# Patient Record
Sex: Female | Born: 1935
Health system: Southern US, Community
[De-identification: ages and names within clinical notes are randomized; demographics above are authoritative.]

## PROBLEM LIST (undated history)

## (undated) DIAGNOSIS — M549 Dorsalgia, unspecified: Secondary | ICD-10-CM

## (undated) DIAGNOSIS — E119 Type 2 diabetes mellitus without complications: Secondary | ICD-10-CM

## (undated) DIAGNOSIS — I10 Essential (primary) hypertension: Secondary | ICD-10-CM

## (undated) HISTORY — DX: Dorsalgia, unspecified: M54.9

## (undated) HISTORY — DX: Type 2 diabetes mellitus without complications: E11.9

## (undated) HISTORY — PX: ABDOMINAL HYSTERECTOMY: SHX81

---

## 1998-03-23 ENCOUNTER — Ambulatory Visit (HOSPITAL_COMMUNITY): Admission: RE | Admit: 1998-03-23 | Discharge: 1998-03-23 | Payer: Self-pay | Admitting: Internal Medicine

## 1999-03-27 ENCOUNTER — Ambulatory Visit (HOSPITAL_COMMUNITY): Admission: RE | Admit: 1999-03-27 | Discharge: 1999-03-27 | Payer: Self-pay | Admitting: Internal Medicine

## 1999-03-27 ENCOUNTER — Encounter: Payer: Self-pay | Admitting: Internal Medicine

## 2000-04-01 ENCOUNTER — Ambulatory Visit (HOSPITAL_COMMUNITY): Admission: RE | Admit: 2000-04-01 | Discharge: 2000-04-01 | Payer: Self-pay | Admitting: Internal Medicine

## 2000-04-01 ENCOUNTER — Encounter: Payer: Self-pay | Admitting: Internal Medicine

## 2001-01-09 ENCOUNTER — Encounter: Payer: Self-pay | Admitting: Internal Medicine

## 2001-01-09 ENCOUNTER — Encounter: Admission: RE | Admit: 2001-01-09 | Discharge: 2001-01-09 | Payer: Self-pay | Admitting: Internal Medicine

## 2001-04-07 ENCOUNTER — Ambulatory Visit (HOSPITAL_COMMUNITY): Admission: RE | Admit: 2001-04-07 | Discharge: 2001-04-07 | Payer: Self-pay | Admitting: Internal Medicine

## 2001-04-07 ENCOUNTER — Encounter: Payer: Self-pay | Admitting: Internal Medicine

## 2001-05-19 ENCOUNTER — Encounter: Admission: RE | Admit: 2001-05-19 | Discharge: 2001-05-19 | Payer: Self-pay | Admitting: Internal Medicine

## 2001-05-19 ENCOUNTER — Encounter: Payer: Self-pay | Admitting: Internal Medicine

## 2002-04-08 ENCOUNTER — Encounter: Payer: Self-pay | Admitting: Internal Medicine

## 2002-04-08 ENCOUNTER — Encounter: Admission: RE | Admit: 2002-04-08 | Discharge: 2002-04-08 | Payer: Self-pay | Admitting: Internal Medicine

## 2002-08-05 ENCOUNTER — Ambulatory Visit (HOSPITAL_COMMUNITY): Admission: RE | Admit: 2002-08-05 | Discharge: 2002-08-05 | Payer: Self-pay | Admitting: *Deleted

## 2002-08-05 ENCOUNTER — Encounter (INDEPENDENT_AMBULATORY_CARE_PROVIDER_SITE_OTHER): Payer: Self-pay | Admitting: Specialist

## 2003-04-21 ENCOUNTER — Encounter: Payer: Self-pay | Admitting: Internal Medicine

## 2003-04-21 ENCOUNTER — Encounter: Admission: RE | Admit: 2003-04-21 | Discharge: 2003-04-21 | Payer: Self-pay | Admitting: Internal Medicine

## 2003-04-28 ENCOUNTER — Emergency Department (HOSPITAL_COMMUNITY): Admission: EM | Admit: 2003-04-28 | Discharge: 2003-04-28 | Payer: Self-pay

## 2003-10-01 HISTORY — PX: COLON SURGERY: SHX602

## 2003-12-10 ENCOUNTER — Emergency Department (HOSPITAL_COMMUNITY): Admission: AD | Admit: 2003-12-10 | Discharge: 2003-12-10 | Payer: Self-pay | Admitting: Internal Medicine

## 2004-02-11 ENCOUNTER — Inpatient Hospital Stay (HOSPITAL_COMMUNITY): Admission: EM | Admit: 2004-02-11 | Discharge: 2004-02-22 | Payer: Self-pay | Admitting: Emergency Medicine

## 2004-02-11 ENCOUNTER — Encounter (INDEPENDENT_AMBULATORY_CARE_PROVIDER_SITE_OTHER): Payer: Self-pay | Admitting: *Deleted

## 2004-05-31 ENCOUNTER — Encounter: Admission: RE | Admit: 2004-05-31 | Discharge: 2004-05-31 | Payer: Self-pay | Admitting: Internal Medicine

## 2004-06-16 ENCOUNTER — Encounter: Admission: RE | Admit: 2004-06-16 | Discharge: 2004-06-16 | Payer: Self-pay | Admitting: Internal Medicine

## 2004-08-28 ENCOUNTER — Ambulatory Visit (HOSPITAL_COMMUNITY): Admission: RE | Admit: 2004-08-28 | Discharge: 2004-08-28 | Payer: Self-pay | Admitting: *Deleted

## 2004-12-08 ENCOUNTER — Emergency Department (HOSPITAL_COMMUNITY): Admission: EM | Admit: 2004-12-08 | Discharge: 2004-12-08 | Payer: Self-pay | Admitting: Family Medicine

## 2005-06-18 ENCOUNTER — Encounter: Admission: RE | Admit: 2005-06-18 | Discharge: 2005-06-18 | Payer: Self-pay | Admitting: Internal Medicine

## 2005-08-28 ENCOUNTER — Ambulatory Visit (HOSPITAL_COMMUNITY): Admission: RE | Admit: 2005-08-28 | Discharge: 2005-08-28 | Payer: Self-pay | Admitting: Internal Medicine

## 2006-01-27 ENCOUNTER — Emergency Department (HOSPITAL_COMMUNITY): Admission: EM | Admit: 2006-01-27 | Discharge: 2006-01-27 | Payer: Self-pay | Admitting: Family Medicine

## 2006-06-23 ENCOUNTER — Encounter: Admission: RE | Admit: 2006-06-23 | Discharge: 2006-06-23 | Payer: Self-pay | Admitting: Internal Medicine

## 2007-06-25 ENCOUNTER — Encounter: Admission: RE | Admit: 2007-06-25 | Discharge: 2007-06-25 | Payer: Self-pay | Admitting: Internal Medicine

## 2007-07-05 ENCOUNTER — Emergency Department (HOSPITAL_COMMUNITY): Admission: EM | Admit: 2007-07-05 | Discharge: 2007-07-05 | Payer: Self-pay | Admitting: Family Medicine

## 2007-09-25 ENCOUNTER — Emergency Department (HOSPITAL_COMMUNITY): Admission: EM | Admit: 2007-09-25 | Discharge: 2007-09-25 | Payer: Self-pay | Admitting: Family Medicine

## 2008-02-27 ENCOUNTER — Emergency Department (HOSPITAL_COMMUNITY): Admission: EM | Admit: 2008-02-27 | Discharge: 2008-02-27 | Payer: Self-pay | Admitting: Emergency Medicine

## 2008-06-07 ENCOUNTER — Emergency Department (HOSPITAL_COMMUNITY): Admission: EM | Admit: 2008-06-07 | Discharge: 2008-06-07 | Payer: Self-pay | Admitting: Emergency Medicine

## 2008-06-27 ENCOUNTER — Encounter: Admission: RE | Admit: 2008-06-27 | Discharge: 2008-06-27 | Payer: Self-pay | Admitting: Internal Medicine

## 2008-06-30 ENCOUNTER — Encounter: Admission: RE | Admit: 2008-06-30 | Discharge: 2008-06-30 | Payer: Self-pay | Admitting: Internal Medicine

## 2008-07-18 ENCOUNTER — Encounter: Admission: RE | Admit: 2008-07-18 | Discharge: 2008-07-18 | Payer: Self-pay | Admitting: Internal Medicine

## 2009-07-04 ENCOUNTER — Encounter: Admission: RE | Admit: 2009-07-04 | Discharge: 2009-07-04 | Payer: Self-pay | Admitting: Internal Medicine

## 2010-04-28 ENCOUNTER — Emergency Department (HOSPITAL_COMMUNITY): Admission: EM | Admit: 2010-04-28 | Discharge: 2010-04-28 | Payer: Self-pay | Admitting: Emergency Medicine

## 2010-07-05 ENCOUNTER — Encounter: Admission: RE | Admit: 2010-07-05 | Discharge: 2010-07-05 | Payer: Self-pay | Admitting: Internal Medicine

## 2011-02-15 NOTE — Op Note (Signed)
   NAMETKAI, LARGE                           ACCOUNT NO.:  [id number]   MEDICAL RECORD NO.:  [id number]                   PATIENT TYPE:  AMB   LOCATION:  ENDO                                 FACILITY:  [facility]   PHYSICIAN:  Georgiana Spinner, M.D.                 DATE OF BIRTH:  Feb 15, 1930   DATE OF PROCEDURE:  DATE OF DISCHARGE:                                 OPERATIVE REPORT   PROCEDURE PERFORMED:  Colonoscopy.   ENDOSCOPIST:  Georgiana Spinner, M.D.   INDICATIONS FOR PROCEDURE:  Colon polyp, rectal bleeding.   ANESTHESIA:  Demerol 40 mg, Versed 3 mg.   DESCRIPTION OF PROCEDURE:  With the patient mildly sedated in the left  lateral decubitus position, the Olympus video colonoscope was inserted in  the rectum and passed under direct vision to the cecum, identified by the  ileocecal valve and appendiceal orifice.  We washed the cecum as best we  could, suctioning debris.  From this point the colonoscope was slowly  withdrawn taking circumferential views of the entire colonic mucosa stopping  in the descending colon where a polyp was seen, photographed and removed  using the hot biopsy forceps technique setting of 20/20 blended current  until we reached the rectum which appeared normal on direct and retroflex  view.  The endoscope was straightened and withdrawn.  The patient's vital  signs and pulse oximeter remained stable.  The patient tolerated the  procedure well without apparent complications.   FINDINGS:  Small polyp of descending colon, otherwise unremarkable exam.   PLAN:  Await biopsy report.  Patient will call me for results and follow up  with me as an outpatient.                                                 Georgiana Spinner, M.D.    GMO/MEDQ  D:  08/05/2002  T:  08/05/2002  Job:  [id number]

## 2011-02-15 NOTE — H&P (Signed)
NAMEABIE, KILLIAN                           ACCOUNT NO.:  [id number]   MEDICAL RECORD NO.:  [id number]                   PATIENT TYPE:  INP   LOCATION:  1825                                 FACILITY:  [facility]   PHYSICIAN:  Abigail Miyamoto, M.D.              DATE OF BIRTH:  1930-03-15   DATE OF ADMISSION:  02/11/2004  DATE OF DISCHARGE:                                HISTORY & PHYSICAL   CHIEF COMPLAINT:  Abdominal pain.   HISTORY:  Karen [name] is a 77 year old female who presents with a 24 hour  history of worsening abdominal pain, nausea and vomiting.  She repots the  pain came on suddenly yesterday and continued to get worse, resulting in  nausea and vomiting.  She reports the pain is crampy in nature and  continuous.  She did have a normal bowel movement this morning, but since  then has passed no flatus and not been able to move her bowels as well.  She  remains nauseated and has thrown up after her CAT scan was performed.  She  denies chest pain, shortness of breath, or dysuria.  She has had no previous  history of abdominal pain.   PAST MEDICAL HISTORY:  Diabetes.   PAST SURGICAL HISTORY:  Hysterectomy.   MEDICATIONS:  Insulin, Glucotrol, Accupril, Lipitor, Actos.   PRIMARY CARE PHYSICIAN:  Veverly Fells. Al[place], M.D.   ALLERGIES:  No known drug allergies.   SOCIAL HISTORY:  She does not smoke and does not drink alcohol.   REVIEW OF SYSTEMS:  Negative from cardiopulmonary standpoint.  It is only  significant for the abdominal pain.   PHYSICAL EXAMINATION:  GENERAL:  Reveals an obese black female who is  uncomfortable.  She is uncomfortable.  VITAL SIGNS:  Temperature is 98.2, respiratory rate 16, heart rate 95, blood  pressure is 159/92.  EYES:  She is anicteric.  Pupils reactive bilaterally.  EARS, NOSE, MOUTH, AND THROAT:  External ears and nose are normal.  Hearing  is normal.  Oropharynx is clear.  NECK:  Supple.  There is no cervical adenopathy.  There is no  thyromegaly.  LUNGS:  Clear to auscultation bilaterally with normal respiratory effort.  CARDIOVASCULAR:  Mildly tachycardic with a regular rate and rhythm.  ABDOMEN:  Distended and obese.  There is tenderness and diffuse guarding.  There is a well healed lower midline incision without evidence of hernia.  There are no bowel sounds.  EXTREMITIES:  Show no edema, clubbing, or cyanosis.  Normal pulses  throughout.  NEUROLOGIC:  She is grossly intact.   LABORATORY DATA:  The patient has a white blood count of 8.8 with a left  shift of 88% neutrophils, hemoglobin is 14.8.  BUN and creatinine are 15 and  0.5, potassium is 5.0, glucose is 168.  Amylase and lipase are normal.  Urinalysis shows 0-2 white blood cells.   ANCILLARY DATA:  The patient  had a CAT scan of the abdomen and pelvis  showing her to have a small-bowel obstruction with possible closed loop  obstruction and positive ascites.   IMPRESSION/PLAN:  This is a patient with a small-bowel obstruction and  findings on CAT scan and physical examination worrisome for ischemic bowel.  At this point, given these findings, the plan is to proceed to the operating  room for exploratory laparotomy and possible bowel resection.  I discussed  this with the patient and her family in detail.  We discussed the risks of  surgery including bleeding, infection, need for bowel resection, ostomy,  etcetera.  At that point, they wish to proceed.  Surgery will thus be  scheduled emergently.                                                Abigail Miyamoto, M.D.    DB/MEDQ  D:  02/11/2004  T:  02/11/2004  Job:  [id number]

## 2011-02-15 NOTE — Discharge Summary (Signed)
[name], [name]                           ACCOUNT NO.:  [id number]   MEDICAL RECORD NO.:  [id number]                   PATIENT TYPE:  INP   LOCATION:  5733                                 FACILITY:  [facility]   PHYSICIAN:  Abigail Miyamoto, M.D.              DATE OF BIRTH:  1930/08/09   DATE OF ADMISSION:  02/11/2004  DATE OF DISCHARGE:  02/22/2004                                 DISCHARGE SUMMARY   DISCHARGE DIAGNOSIS:  Small-bowel obstruction, status post exploratory  laparotomy, lysis of adhesions, removal of bezoar.   OTHER DIAGNOSIS:  Type 2 diabetes.   SUMMARY OF HISTORY:  Ms. Karen [name] is a 77 year old female who presented  to the emergency department with abdominal pain, nausea, and vomiting.  She  was found on examination to have a distended tender abdomen.  A CAT scan of  the abdomen showed the patient to have a possible closed loop obstruction  with ascites.  Given these findings, the decision was made to proceed to the  operating room.  The patient was taken to the operating room where she  underwent exploratory laparotomy and lysis of adhesions, found to have a  bezoar obstructer in her small bowel and this was removed, and she was taken  to a regular surgical floor.  Over the next several days, she had the normal  postoperative care.  She began passing flatus on Feb 14, 2004.  Her diet was  advanced.  She did have drainage from her wound and the wound had to be  opened up and dressing changes were started.  She tolerated a clear liquid  diet and was advanced further.  Her wound, however, continued to have  drainage, so it had to be opened further for the dressing changes.  From the  postoperative standpoint, she was returning to normal bowel function and by  Feb 19, 2004, she was tolerating a regular diet.  Her wound was clean, and  she was tolerating the dressing changes.  By Feb 22, 2004, she was  ambulating, having normal bowel movements.  She was afebrile.  Her  incision  was clean with good granulation tissue.  The decision was made to discharge  the patient home.   Home health is arranged for b.i.d. wet-to-dry dressing changes.   DISCHARGE MEDICATIONS:  1. She will resume her home medications.  2. She will take Vicodin for pain.  3. She will take Tylenol Advil as needed.  Her other medications include:  1. Humalog.  2. Glucotrol.  3. Actos.  4. Lipitor.  5. Aspirin.  6. Accupril.   She will follow up with Dr. Veverly Fells. Altheimer.  She will also follow up  in my office one week post discharge.   ACTIVITY:  She is to do only light activity with no heavy lifting.  Abigail Miyamoto, M.D.    DB/MEDQ  D:  04/03/2004  T:  04/04/2004  Job:  [id number]

## 2011-02-15 NOTE — Op Note (Signed)
   [name], [name]                           ACCOUNT NO.:  [id number]   MEDICAL RECORD NO.:  [id number]                   PATIENT TYPE:  AMB   LOCATION:  ENDO                                 FACILITY:  [facility]   PHYSICIAN:  Georgiana Spinner, M.D.                 DATE OF BIRTH:  [birth year]   DATE OF PROCEDURE:  08/05/2002  DATE OF DISCHARGE:                                 OPERATIVE REPORT   PROCEDURE:  Upper endoscopy.   INDICATIONS:  GERD.   ANESTHESIA:  Demerol 50 mg, Versed 5 mg.   DESCRIPTION OF PROCEDURE:  With the patient mildly sedated in the left  lateral decubitus position, the Olympus videoscopic endoscope was inserted  in the mouth, passed under direct vision through the esophagus, which  appeared normal except for a questionable area of Barrett's which was  photographed and biopsied.  We entered into the stomach.  The fundus, body,  antrum, duodenal bulb, and second portion of the duodenum all appeared  normal.  From this point the endoscope was slowly withdrawn, taking  circumferential views of the remaining gastric and esophageal mucosa.  The  patient's vital signs and pulse oximetry remained stable.  The patient  tolerated the procedure well without apparent complications.   FINDINGS:  Questionable Barrett's esophagus, biopsied.   PLAN:  Await biopsy report.  The patient will call me for results and follow  up with me as an outpatient.  Proceed to colonoscopy as planned.                                               Georgiana Spinner, M.D.    GMO/MEDQ  D:  08/05/2002  T:  08/05/2002  Job:  [id number]

## 2011-02-15 NOTE — Op Note (Signed)
NAMEHAGAN, MALTZ                           ACCOUNT NO.:  [id number]   MEDICAL RECORD NO.:  [id number]                   PATIENT TYPE:  INP   LOCATION:  5733                                 FACILITY:  [facility]   PHYSICIAN:  Abigail Miyamoto, M.D.              DATE OF BIRTH:  07-Nov-1929   DATE OF PROCEDURE:  02/11/2004  DATE OF DISCHARGE:                                 OPERATIVE REPORT   PREOPERATIVE DIAGNOSIS:  Small bowel obstruction.   POSTOPERATIVE DIAGNOSIS:  Small bowel obstruction.   PROCEDURE:  1. Exploratory laparotomy.  2. Lysis of adhesions.  3. Excision of small bowel bezoar.   SURGEON:  Abigail Miyamoto, M.D.   ASSISTANT:  Lebron Conners, M.D.   ANESTHESIA:  General endotracheal.   ESTIMATED BLOOD LOSS:  Minimal.   INDICATIONS:  Karen [name] is a 77 year old female who presents with a small  bowel obstruction.  CAT scan shows what appears to be closed loop  obstruction with possible inspissated stool in the small bowel.  Given her  tenderness on physical examination, decision was made to proceed to the  operating room for exploration.   FINDINGS:  The patient was noted to have multiple extensive adhesions.  She  had a very large bezoar in the small intestine causing the obstruction.  This was removed through a single enterotomy.   DESCRIPTION OF PROCEDURE:  The patient was brought to the operating room and  identified as Karen [name].  She was placed supine on the operating table  and general anesthesia was induced.  Her abdomen was then prepped and draped  in the usual sterile fashion.  Using a #10 blade a midline incision was made  and incision was carried down through the fascia with the electrocautery.  The peritoneum was opened the entire length of the incision.  Upon entering  the abdomen, the patient was found to have multiple adhesions consisting of  omentum to the abdominal wall.  This was taken down with the electrocautery.  The patient was found to  have multiple loops of small bowel stuck to the  pelvis which were elevated after taking down with the Metzenbaum scissors.  After extensive lysis of adhesions, the area of obstruction could be  identified and was found to be a foreign body in the small intestines.  After the small intestines were finally freed up, enterotomy was created and  a large bezoar was removed from the small intestines.  This was sent to  pathology for identification.  The enterotomy was then closed in a  transverse fashion with interrupted 3-0 silk sutures.  The repair was  tested, and no leak was identified.  The rest of the small bowel was  examined and no further obstruction was identified.  At this point, the  abdomen was irrigated with normal saline.  The fascia was then closed with  running #1 PDS suture.  The skin was irrigated  and closed with skin  staples.  Gauze and tape were then applied.  The patient tolerated the  procedure well.  All sponge, needle and instrument counts were correct at  the end of the procedure.  The patient was then extubated in the operating  room and taken to recovery room.                                               Abigail Miyamoto, M.D.    DB/MEDQ  D:  02/11/2004  T:  02/12/2004  Job:  [id number]

## 2011-02-15 NOTE — Op Note (Signed)
Karen [name], Karen [name]                 ACCOUNT NO.:  [id number]   MEDICAL RECORD NO.:  [id number]          PATIENT TYPE:  AMB   LOCATION:  ENDO                         FACILITY:  [facility]   PHYSICIAN:  Georgiana Spinner, M.D.    DATE OF BIRTH:  [birth year]   DATE OF PROCEDURE:  08/28/2004  DATE OF DISCHARGE:                                 OPERATIVE REPORT   PROCEDURE PERFORMED:  Colonoscopy.   ENDOSCOPIST:  Georgiana Spinner, M.D.   INDICATIONS FOR PROCEDURE:  Colon polyps.   ANESTHESIA:  Demerol 75 mg, Versed 7.5 mg   DESCRIPTION OF PROCEDURE:  With the patient mildly sedated in the left  lateral decubitus position, the Olympus videoscopic colonoscope was inserted  in the rectum and passed under direct vision to the cecum, identified by the  ileocecal valve and base of cecum, both of which were photographed.  From  this point the colonoscope was slowly withdrawn taking circumferential views  of the colonic mucosa stopping only in the rectum which appeared normal on  direct and showed hemorrhoids on retroflex view.  The endoscope was  straightened and withdrawn.  The patient's vital signs and pulse oximeter  remained stable.  The patient tolerated the procedure well without apparent  complications.   FINDINGS:  Internal hemorrhoids.  Occasional diverticula in the sigmoid  colon, otherwise unremarkable examination.   PLAN:  Consider repeat examination in five years.       GMO/MEDQ  D:  08/28/2004  T:  08/28/2004  Job:  [id number]

## 2011-05-28 ENCOUNTER — Other Ambulatory Visit: Payer: Self-pay | Admitting: Internal Medicine

## 2011-05-28 DIAGNOSIS — Z1231 Encounter for screening mammogram for malignant neoplasm of breast: Secondary | ICD-10-CM

## 2011-06-26 LAB — DIFFERENTIAL
Basophils Absolute: 0
Basophils Relative: 0
Eosinophils Absolute: 0
Eosinophils Relative: 0
Monocytes Absolute: 0.2

## 2011-06-26 LAB — POCT I-STAT, CHEM 8
Chloride: 107
HCT: 43
Potassium: 4.2
Sodium: 143

## 2011-06-26 LAB — CBC
HCT: 39.9
MCV: 85.7

## 2011-07-03 LAB — CBC
HCT: 38.7
MCHC: 32.4
MCV: 86.8
WBC: 5.4

## 2011-07-03 LAB — POCT I-STAT, CHEM 8
BUN: 19
Calcium, Ion: 1.26
Glucose, Bld: 57 — ABNORMAL LOW
HCT: 40
Hemoglobin: 13.6

## 2011-07-03 LAB — DIFFERENTIAL
Basophils Absolute: 0
Eosinophils Relative: 1
Monocytes Absolute: 0.6
Monocytes Relative: 12
Neutro Abs: 3.2

## 2011-07-03 LAB — D-DIMER, QUANTITATIVE: D-Dimer, Quant: 0.26

## 2011-07-08 ENCOUNTER — Ambulatory Visit
Admission: RE | Admit: 2011-07-08 | Discharge: 2011-07-08 | Disposition: A | Discharge status: Home / Self Care | Payer: Medicare Other | Source: Ambulatory Visit | Attending: Internal Medicine | Admitting: Internal Medicine

## 2011-07-08 DIAGNOSIS — Z1231 Encounter for screening mammogram for malignant neoplasm of breast: Secondary | ICD-10-CM

## 2012-06-03 ENCOUNTER — Other Ambulatory Visit: Payer: Self-pay | Admitting: Internal Medicine

## 2012-06-03 DIAGNOSIS — Z1231 Encounter for screening mammogram for malignant neoplasm of breast: Secondary | ICD-10-CM

## 2012-07-08 ENCOUNTER — Ambulatory Visit
Admission: RE | Admit: 2012-07-08 | Discharge: 2012-07-08 | Disposition: A | Discharge status: Home / Self Care | Payer: Medicare Other | Source: Ambulatory Visit | Attending: Internal Medicine | Admitting: Internal Medicine

## 2012-07-08 DIAGNOSIS — Z1231 Encounter for screening mammogram for malignant neoplasm of breast: Secondary | ICD-10-CM

## 2013-06-08 ENCOUNTER — Other Ambulatory Visit: Payer: Self-pay

## 2013-06-08 DIAGNOSIS — Z1231 Encounter for screening mammogram for malignant neoplasm of breast: Secondary | ICD-10-CM

## 2013-07-09 ENCOUNTER — Ambulatory Visit
Admission: RE | Admit: 2013-07-09 | Discharge: 2013-07-09 | Disposition: A | Discharge status: Home / Self Care | Payer: Medicare Other | Source: Ambulatory Visit

## 2013-07-09 DIAGNOSIS — Z1231 Encounter for screening mammogram for malignant neoplasm of breast: Secondary | ICD-10-CM

## 2014-01-17 ENCOUNTER — Encounter: Payer: Self-pay | Admitting: Podiatry

## 2014-01-17 ENCOUNTER — Ambulatory Visit (INDEPENDENT_AMBULATORY_CARE_PROVIDER_SITE_OTHER): Payer: Medicare Other | Admitting: Podiatry

## 2014-01-17 VITALS — BP 124/66 | HR 78 | Resp 15 | Ht 61.0 in | Wt 160.0 lb

## 2014-01-17 DIAGNOSIS — M2040 Other hammer toe(s) (acquired), unspecified foot: Secondary | ICD-10-CM

## 2014-01-17 NOTE — Progress Notes (Signed)
   Subjective:    Patient ID: Karen [name], female    DOB: 1930-06-09, 80 y.o.   MRN: [id number]  HPI Comments: N toe pain L B/L 1 - 5 toe tips D 3 month O on and off C stinging A enclosed shoes T home pedicure, non treatment for the toe tenderness except Diabetic lotion  Pt request diabetic shoes.  Toe Pain       Review of Systems  All other systems reviewed and are negative.      Objective:   Physical Exam  Orientated x3 space female  Vascular: DP and PT pulses 1/4 bilaterally  Neurological: sensation to 10 g monofilament wire intact 5/5 bilaterally. Vibratory sensation intact bilaterally. Ankle reflexes reactive bilaterally.  Dermatological: Discolored, incurvated toenails x10  Musculoskeletal: Hammertoes 2, bilaterally. Taylor's bunions bilaterally.       Assessment & Plan:   Assessment: Satisfactory neurovascular status Hammertoe deformities second bilaterally Taylor's bunion deformity bilaterally Symptoms of stinging may suggest beginning diabetic neuropathy Asymptomatic onychomycoses  Plan: Patient is given General Information about diabetic footcare. Today I just gently burred down the toenails without a bleeding Advised patient that her symptoms of stinging may beginning of diabetic neuropathy Symptoms may exacerbated by hammertoes  Reappoint at patient's request or anteriorly intervals.

## 2014-01-17 NOTE — Patient Instructions (Signed)
Diabetes and Foot Care Diabetes may cause you to have problems because of poor blood supply (circulation) to your feet and legs. This may cause the skin on your feet to become thinner, break easier, and heal more slowly. Your skin may become dry, and the skin may peel and crack. You may also have nerve damage in your legs and feet causing decreased feeling in them. You may not notice minor injuries to your feet that could lead to infections or more serious problems. Taking care of your feet is one of the most important things you can do for yourself.  HOME CARE INSTRUCTIONS  Wear shoes at all times, even in the house. Do not go barefoot. Bare feet are easily injured.  Check your feet daily for blisters, cuts, and redness. If you cannot see the bottom of your feet, use a mirror or ask someone for help.  Wash your feet with warm water (do not use hot water) and mild soap. Then pat your feet and the areas between your toes until they are completely dry. Do not soak your feet as this can dry your skin.  Apply a moisturizing lotion or petroleum jelly (that does not contain alcohol and is unscented) to the skin on your feet and to dry, brittle toenails. Do not apply lotion between your toes.  Trim your toenails straight across. Do not dig under them or around the cuticle. File the edges of your nails with an emery board or nail file.  Do not cut corns or calluses or try to remove them with medicine.  Wear clean socks or stockings every day. Make sure they are not too tight. Do not wear knee-high stockings since they may decrease blood flow to your legs.  Wear shoes that fit properly and have enough cushioning. To break in new shoes, wear them for just a few hours a day. This prevents you from injuring your feet. Always look in your shoes before you put them on to be sure there are no objects inside.  Do not cross your legs. This may decrease the blood flow to your feet.  If you find a minor scrape,  cut, or break in the skin on your feet, keep it and the skin around it clean and dry. These areas may be cleansed with mild soap and water. Do not cleanse the area with peroxide, alcohol, or iodine.  When you remove an adhesive bandage, be sure not to damage the skin around it.  If you have a wound, look at it several times a day to make sure it is healing.  Do not use heating pads or hot water bottles. They may burn your skin. If you have lost feeling in your feet or legs, you may not know it is happening until it is too late.  Make sure your health care provider performs a complete foot exam at least annually or more often if you have foot problems. Report any cuts, sores, or bruises to your health care provider immediately. SEEK MEDICAL CARE IF:   You have an injury that is not healing.  You have cuts or breaks in the skin.  You have an ingrown nail.  You notice redness on your legs or feet.  You feel burning or tingling in your legs or feet.  You have pain or cramps in your legs and feet.  Your legs or feet are numb.  Your feet always feel cold. SEEK IMMEDIATE MEDICAL CARE IF:   There is increasing redness,   swelling, or pain in or around a wound.  There is a red line that goes up your leg.  Pus is coming from a wound.  You develop a fever or as directed by your health care provider.  You notice a bad smell coming from an ulcer or wound. Document Released: 09/13/2000 Document Revised: 05/19/2013 Document Reviewed: 02/23/2013 [employer] Patient Information 2014 [employer].  

## 2014-01-18 ENCOUNTER — Encounter: Payer: Self-pay | Admitting: Podiatry

## 2014-06-07 ENCOUNTER — Other Ambulatory Visit: Payer: Self-pay

## 2014-06-07 DIAGNOSIS — Z1231 Encounter for screening mammogram for malignant neoplasm of breast: Secondary | ICD-10-CM

## 2014-07-11 ENCOUNTER — Ambulatory Visit
Admission: RE | Admit: 2014-07-11 | Discharge: 2014-07-11 | Disposition: A | Discharge status: Home / Self Care | Payer: Medicare Other | Source: Ambulatory Visit

## 2014-07-11 DIAGNOSIS — Z1231 Encounter for screening mammogram for malignant neoplasm of breast: Secondary | ICD-10-CM

## 2014-10-10 ENCOUNTER — Emergency Department (HOSPITAL_COMMUNITY): Admission: EM | Admit: 2014-10-10 | Discharge: 2014-10-10 | Payer: Medicare Other | Source: Home / Self Care

## 2015-03-10 ENCOUNTER — Encounter (HOSPITAL_COMMUNITY): Payer: Self-pay | Admitting: *Deleted

## 2015-03-10 ENCOUNTER — Emergency Department (HOSPITAL_COMMUNITY)
Admission: EM | Admit: 2015-03-10 | Discharge: 2015-03-10 | Disposition: A | Discharge status: Home / Self Care | Payer: Medicare Other | Source: Home / Self Care | Attending: Family Medicine | Admitting: Family Medicine

## 2015-03-10 DIAGNOSIS — M5442 Lumbago with sciatica, left side: Secondary | ICD-10-CM | POA: Diagnosis not present

## 2015-03-10 MED ORDER — TRAMADOL HCL 50 MG PO TABS: 50.0000 mg | ORAL_TABLET | Freq: Four times a day (QID) | ORAL | Status: DC | PRN

## 2015-03-10 NOTE — ED Notes (Signed)
Pt is here with complaints of back and hip muscle pain. Pt states it "cramps up" when she coughs.

## 2015-03-10 NOTE — Discharge Instructions (Signed)
Thank you for coming in today. °Come back or go to the emergency room if you notice new weakness new numbness problems walking or bowel or bladder problems. ° ° °Back Exercises °Back exercises help treat and prevent back injuries. The goal is to increase your strength in your belly (abdominal) and back muscles. These exercises can also help with flexibility. Start these exercises when told by your doctor. °HOME CARE °Back exercises include: °Pelvic Tilt. °· Lie on your back with your knees bent. Tilt your pelvis until the lower part of your back is against the floor. Hold this position 5 to 10 sec. Repeat this exercise 5 to 10 times. °Knee to Chest. °· Pull 1 knee up against your chest and hold for 20 to 30 seconds. Repeat this with the other knee. This may be done with the other leg straight or bent, whichever feels better. Then, pull both knees up against your chest. °Sit-Ups or Curl-Ups. °· Bend your knees 90 degrees. Start with tilting your pelvis, and do a partial, slow sit-up. Only lift your upper half 30 to 45 degrees off the floor. Take at least 2 to 3 seonds for each sit-up. Do not do sit-ups with your knees out straight. If partial sit-ups are difficult, simply do the above but with only tightening your belly (abdominal) muscles and holding it as told. °Hip-Lift. °· Lie on your back with your knees flexed 90 degrees. Push down with your feet and shoulders as you raise your hips 2 inches off the floor. Hold for 10 seconds, repeat 5 to 10 times. °Back Arches. °· Lie on your stomach. Prop yourself up on bent elbows. Slowly press on your hands, causing an arch in your low back. Repeat 3 to 5 times. °Shoulder-Lifts. °· Lie face down with arms beside your body. Keep hips and belly pressed to floor as you slowly lift your head and shoulders off the floor. °Do not overdo your exercises. Be careful in the beginning. Exercises may cause you some mild back discomfort. If the pain lasts for more than 15 minutes, stop  the exercises until you see your doctor. Improvement with exercise for back problems is slow.  °Document Released: 10/19/2010 Document Revised: 12/09/2011 Document Reviewed: 07/18/2011 °[employer]® Patient Information ©2015 [employer]. This information is not intended to replace advice given to you by your health care provider. Make sure you discuss any questions you have with your health care provider. ° °

## 2015-03-10 NOTE — ED Provider Notes (Signed)
Karen [name] is a 81 y.o. female who presents to Urgent Care today for back pain. Patient notes new onset of acute low back pain starting a week ago. The pain is felt in the left buttocks and radiates to the left knee. She denies any new weakness or numbness bowel bladder dysfunction or difficulty walking. She's tried Tylenol and leftover tramadol which helped a lot. She ran out of tramadol. Wife. She denies any injury. No fevers or chills vomiting or diarrhea.   Past Medical History  Diagnosis Date  . Diabetes mellitus without complication   . Back pain    Past Surgical History  Procedure Laterality Date  . Abdominal hysterectomy    . Colon surgery  2005    blockage   History  Substance Use Topics  . Smoking status: Never Smoker   . Smokeless tobacco: Not on file  . Alcohol Use: No   ROS as above Medications: No current facility-administered medications for this encounter.   Current Outpatient Prescriptions  Medication Sig Dispense Refill  . Acetaminophen (TYLENOL 8 HOUR PO) Take by mouth as needed.    . Ascorbic Acid (VITAMIN C) 100 MG tablet Take 100 mg by mouth daily.    Marland Kitchen aspirin 81 MG tablet Take 81 mg by mouth daily.    . Calcium Carbonate-Vitamin D (CALCIUM + D PO) Take by mouth.    Marland Kitchen glipiZIDE (GLUCOTROL XL) 5 MG 24 hr tablet Take 5 mg by mouth daily with breakfast.    . HYDROcodone-acetaminophen (NORCO/VICODIN) 5-325 MG per tablet Take 1 tablet by mouth every 6 (six) hours as needed for moderate pain.    Marland Kitchen insulin lispro protamine-lispro (HUMALOG 75/25 MIX) (75-25) 100 UNIT/ML SUSP injection Inject into the skin 2 (two) times daily with a meal.    . liver oil-zinc oxide (DESITIN) 40 % ointment Apply 1 application topically as needed for irritation.    . Multiple Vitamins-Minerals (MULTI COMPLETE PO) Take by mouth.    . Omega-3 Fatty Acids (FISH OIL) 1000 MG CAPS Take by mouth.    . quinapril (ACCUPRIL) 5 MG tablet Take 5 mg by mouth at bedtime.    . traMADol (ULTRAM)  50 MG tablet Take 1 tablet (50 mg total) by mouth every 6 (six) hours as needed. 20 tablet 0   No Known Allergies   Exam:  BP 166/83 mmHg  Pulse 80  Temp(Src) 97.2 F (36.2 C) (Oral)  SpO2 95% Gen: Well NAD HEENT: EOMI,  MMM Lungs: Normal work of breathing. CTABL Heart: RRR no MRG Abd: NABS, Soft. Nondistended, Nontender Exts: Brisk capillary refill, warm and well perfused.  Back: Nontender to spinal midline. Tender palpation left lumbar paraspinal and SI joint.  Negative straight leg raise test and slump test bilaterally. Positive left-sided Pearlean Brownie test negative right. Antalgic gait. Patient uses a cane to ambulate. Lower extreme strength is intact. She can climb onto and off exam table and stand up easily. Reflexes are diminished but equal bilateral knees and ankles. Sensation is intact throughout  No results found for this or any previous visit (from the past 24 hour(s)). No results found.  Assessment and Plan: 81 y.o. female with lumbago with possible sciatica. Treat with tramadol and Tylenol. Avoid prednisone and NSAIDs if able due to diabetes and elevated blood pressure. Follow-up with PCP.  Discussed warning signs or symptoms. Please see discharge instructions. Patient expresses understanding.     Rodolph Bong, MD 03/10/15 1902

## 2015-06-06 ENCOUNTER — Other Ambulatory Visit: Payer: Self-pay

## 2015-06-06 DIAGNOSIS — Z1231 Encounter for screening mammogram for malignant neoplasm of breast: Secondary | ICD-10-CM

## 2015-07-14 ENCOUNTER — Ambulatory Visit
Admission: RE | Admit: 2015-07-14 | Discharge: 2015-07-14 | Disposition: A | Discharge status: Home / Self Care | Payer: Medicare Other | Source: Ambulatory Visit

## 2015-07-14 ENCOUNTER — Ambulatory Visit: Payer: Medicare Other

## 2015-07-14 DIAGNOSIS — Z1231 Encounter for screening mammogram for malignant neoplasm of breast: Secondary | ICD-10-CM

## 2015-12-27 ENCOUNTER — Encounter: Payer: Self-pay | Admitting: Podiatry

## 2015-12-27 ENCOUNTER — Ambulatory Visit (INDEPENDENT_AMBULATORY_CARE_PROVIDER_SITE_OTHER): Payer: Medicare Other | Admitting: Podiatry

## 2015-12-27 VITALS — BP 144/68 | HR 82 | Resp 12

## 2015-12-27 DIAGNOSIS — M2041 Other hammer toe(s) (acquired), right foot: Secondary | ICD-10-CM | POA: Diagnosis not present

## 2015-12-27 DIAGNOSIS — L84 Corns and callosities: Secondary | ICD-10-CM | POA: Diagnosis not present

## 2015-12-27 DIAGNOSIS — E119 Type 2 diabetes mellitus without complications: Secondary | ICD-10-CM

## 2015-12-27 NOTE — Progress Notes (Signed)
   Subjective:    Patient ID: Karen ParsleyElnora P Dovidio, female    DOB: [birth year], 82 y.o.   MRN: [id number]  HPI    This patient presents today complaining of a one-month history of pain in a skin lesion on the medial distal second right toe which is uncomfortable walking wearing shoes. Patient has applied Vaseline and a Band-Aid to the area which does reduce the symptoms which are somewhat intermittent. Patient carries her shoes with a wider toe box density reduce some of the discomfort in the second right toe. She denies any infection or professional care  Patient is a diabetic denies any history of ulceration, amputation or claudication  Review of Systems  Skin: Positive for color change.       Objective:   Physical Exam  Orientated 3  Vascular: DP and PT pulses 1/4 bilaterally Capillary reflex immediate bilaterally  Neurological: Sensation to 10 g monofilament wire intact 5/5 right 4/5 left Vibratory sensation reactive bilaterally Ankle reflex equal and reactive bilaterally  Dermatological: No open skin lesions bilaterally Minimal keratoses distal medial second right toe without any localized inflammation  Musculoskeletal: Hammertoe second bilaterally Medial transverse drifting of lateral digits 2-4 bilaterally Bunionette's bilaterally      Assessment & Plan:   Assessment: Type II diabetic without foot complications Hammertoe second right with keratoses  Plan: Today I reviewed the results of examination with patient today. I recommended she wears a shoe with a wide toe box. The keratoses was debrided. Patient advised to apply Vaseline and a Band-Aid to this area on a when necessary basis. Also discussed silicone toe separator or silicone sleeve over second right toe  Return as needed or yearly

## 2015-12-27 NOTE — Patient Instructions (Signed)
Today year diabetic foot exam revealed adequate circulation and feeling in your feet There is a corn on the inside of the second right toe resulting from a hammertoe and friction/rubbing of the second toe against the right big toe As needed apply Vaseline and a small Band-Aid daily to protect the area Return as needed or yearly  Hammer Toes Hammer toes is a condition in which one or more of your toes is permanently flexed. CAUSES  This happens when a muscle imbalance or abnormal bone length makes your small toes buckle. This causes the toe joint to contract and the strong cord-like bands that attach muscles to the bones (tendons) in your toes to shorten.  SIGNS AND SYMPTOMS  Common symptoms of flexible hammer toes include:   A buildup of skin cells (corns). Corns occur where boney bumps come in frequent contact with hard surfaces. For example, where your shoes press and rub.  Irritation.  Inflammation.  Pain.  Limited motion in your toes. DIAGNOSIS  Hammer toes are diagnosed through a physical exam of your toes. During the exam, your health care provider may try to reproduce your symptoms by manipulating your foot. Often, X-ray exams are done to determine the degree of deformity and to make sure that the cause is not a fracture.  TREATMENT  Hammer toes can be treated with corrective surgery. There are several types of surgical procedures that can treat hammer toes. The most common procedures include:  Arthroplasty--A portion of the joint is surgically removed and your toe is straightened. The gap fills in with fibrous tissue. This procedure helps treat pain and deformity and helps restore function.  Fusion--Cartilage between the two bones of the affected joint is taken out and the bones fuse together into one longer bone. This helps keep your toe stable and reduces pain but leaves your toe stiff, yet straight.  Implantation--A portion of your bone is removed and replaced with an  implant to restore motion.  Flexor tendon transfers--This procedure repositions the tendons that curl the toes down (flexor tendons). This may be done to release the deforming force that causes your toe to buckle. Several of these procedures require fixing your toe with a pin that is visible at the tip of your toe. The pin keeps the toe straight during healing. Your health care provider will remove the pin usually within 4-8 weeks after the procedure.    This information is not intended to replace advice given to you by your health care provider. Make sure you discuss any questions you have with your health care provider.   Document Released: 09/13/2000 Document Revised: 09/21/2013 Document Reviewed: 05/24/2013 Elsevier Interactive Patient Education 2016 [employer] Diabetes and Foot Care Diabetes may cause you to have problems because of poor blood supply (circulation) to your feet and legs. This may cause the skin on your feet to become thinner, break easier, and heal more slowly. Your skin may become dry, and the skin may peel and crack. You may also have nerve damage in your legs and feet causing decreased feeling in them. You may not notice minor injuries to your feet that could lead to infections or more serious problems. Taking care of your feet is one of the most important things you can do for yourself.  HOME CARE INSTRUCTIONS  Wear shoes at all times, even in the house. Do not go barefoot. Bare feet are easily injured.  Check your feet daily for blisters, cuts, and redness. If you cannot see the  bottom of your feet, use a mirror or ask someone for help.  Wash your feet with warm water (do not use hot water) and mild soap. Then pat your feet and the areas between your toes until they are completely dry. Do not soak your feet as this can dry your skin.  Apply a moisturizing lotion or petroleum jelly (that does not contain alcohol and is unscented) to the skin on your feet and to dry,  brittle toenails. Do not apply lotion between your toes.  Trim your toenails straight across. Do not dig under them or around the cuticle. File the edges of your nails with an emery board or nail file.  Do not cut corns or calluses or try to remove them with medicine.  Wear clean socks or stockings every day. Make sure they are not too tight. Do not wear knee-high stockings since they may decrease blood flow to your legs.  Wear shoes that fit properly and have enough cushioning. To break in new shoes, wear them for just a few hours a day. This prevents you from injuring your feet. Always look in your shoes before you put them on to be sure there are no objects inside.  Do not cross your legs. This may decrease the blood flow to your feet.  If you find a minor scrape, cut, or break in the skin on your feet, keep it and the skin around it clean and dry. These areas may be cleansed with mild soap and water. Do not cleanse the area with peroxide, alcohol, or iodine.  When you remove an adhesive bandage, be sure not to damage the skin around it.  If you have a wound, look at it several times a day to make sure it is healing.  Do not use heating pads or hot water bottles. They may burn your skin. If you have lost feeling in your feet or legs, you may not know it is happening until it is too late.  Make sure your health care provider performs a complete foot exam at least annually or more often if you have foot problems. Report any cuts, sores, or bruises to your health care provider immediately. SEEK MEDICAL CARE IF:   You have an injury that is not healing.  You have cuts or breaks in the skin.  You have an ingrown nail.  You notice redness on your legs or feet.  You feel burning or tingling in your legs or feet.  You have pain or cramps in your legs and feet.  Your legs or feet are numb.  Your feet always feel cold. SEEK IMMEDIATE MEDICAL CARE IF:   There is increasing redness,  swelling, or pain in or around a wound.  There is a red line that goes up your leg.  Pus is coming from a wound.  You develop a fever or as directed by your health care provider.  You notice a bad smell coming from an ulcer or wound.   This information is not intended to replace advice given to you by your health care provider. Make sure you discuss any questions you have with your health care provider.   Document Released: 09/13/2000 Document Revised: 05/19/2013 Document Reviewed: 02/23/2013 Elsevier Interactive Patient Education [employer] Else[employer] Inc[employer]

## 2016-06-25 ENCOUNTER — Other Ambulatory Visit: Payer: Self-pay | Admitting: Internal Medicine

## 2016-06-25 DIAGNOSIS — Z1231 Encounter for screening mammogram for malignant neoplasm of breast: Secondary | ICD-10-CM

## 2016-06-29 ENCOUNTER — Encounter (HOSPITAL_COMMUNITY): Payer: Self-pay

## 2016-06-29 ENCOUNTER — Observation Stay (HOSPITAL_COMMUNITY)
Admission: EM | Admit: 2016-06-29 | Discharge: 2016-06-30 | Disposition: A | Discharge status: Home / Self Care | Payer: Medicare Other | Attending: Internal Medicine | Admitting: Internal Medicine

## 2016-06-29 ENCOUNTER — Emergency Department (HOSPITAL_COMMUNITY): Payer: Medicare Other

## 2016-06-29 DIAGNOSIS — S0081XA Abrasion of other part of head, initial encounter: Secondary | ICD-10-CM | POA: Diagnosis not present

## 2016-06-29 DIAGNOSIS — Z833 Family history of diabetes mellitus: Secondary | ICD-10-CM | POA: Diagnosis not present

## 2016-06-29 DIAGNOSIS — R55 Syncope and collapse: Secondary | ICD-10-CM | POA: Insufficient documentation

## 2016-06-29 DIAGNOSIS — W19XXXA Unspecified fall, initial encounter: Secondary | ICD-10-CM | POA: Diagnosis not present

## 2016-06-29 DIAGNOSIS — Z794 Long term (current) use of insulin: Secondary | ICD-10-CM | POA: Insufficient documentation

## 2016-06-29 DIAGNOSIS — Z9889 Other specified postprocedural states: Secondary | ICD-10-CM | POA: Insufficient documentation

## 2016-06-29 DIAGNOSIS — S06300A Unspecified focal traumatic brain injury without loss of consciousness, initial encounter: Principal | ICD-10-CM | POA: Insufficient documentation

## 2016-06-29 DIAGNOSIS — Z7982 Long term (current) use of aspirin: Secondary | ICD-10-CM | POA: Insufficient documentation

## 2016-06-29 DIAGNOSIS — E119 Type 2 diabetes mellitus without complications: Secondary | ICD-10-CM | POA: Diagnosis not present

## 2016-06-29 DIAGNOSIS — I629 Nontraumatic intracranial hemorrhage, unspecified: Secondary | ICD-10-CM | POA: Diagnosis present

## 2016-06-29 DIAGNOSIS — Z791 Long term (current) use of non-steroidal anti-inflammatories (NSAID): Secondary | ICD-10-CM | POA: Diagnosis not present

## 2016-06-29 DIAGNOSIS — M1990 Unspecified osteoarthritis, unspecified site: Secondary | ICD-10-CM | POA: Diagnosis not present

## 2016-06-29 DIAGNOSIS — Z90710 Acquired absence of both cervix and uterus: Secondary | ICD-10-CM | POA: Insufficient documentation

## 2016-06-29 DIAGNOSIS — E861 Hypovolemia: Secondary | ICD-10-CM | POA: Insufficient documentation

## 2016-06-29 DIAGNOSIS — I10 Essential (primary) hypertension: Secondary | ICD-10-CM | POA: Diagnosis not present

## 2016-06-29 DIAGNOSIS — Z79899 Other long term (current) drug therapy: Secondary | ICD-10-CM | POA: Insufficient documentation

## 2016-06-29 LAB — CBC
HEMATOCRIT: 39.7 % (ref 36.0–46.0)
Hemoglobin: 13.2 g/dL (ref 12.0–15.0)
MCH: 28.4 pg (ref 26.0–34.0)
MCHC: 33.2 g/dL (ref 30.0–36.0)
MCV: 85.4 fL (ref 78.0–100.0)
PLATELETS: 274 10*3/uL (ref 150–400)
RBC: 4.65 MIL/uL (ref 3.87–5.11)
RDW: 13.7 % (ref 11.5–15.5)
WBC: 5.7 10*3/uL (ref 4.0–10.5)

## 2016-06-29 LAB — I-STAT CHEM 8, ED
BUN: 12 mg/dL (ref 6–20)
CALCIUM ION: 1.19 mmol/L (ref 1.15–1.40)
CHLORIDE: 104 mmol/L (ref 101–111)
CREATININE: 0.8 mg/dL (ref 0.44–1.00)
GLUCOSE: 127 mg/dL — AB (ref 65–99)
HCT: 41 % (ref 36.0–46.0)
Hemoglobin: 13.9 g/dL (ref 12.0–15.0)
POTASSIUM: 4.1 mmol/L (ref 3.5–5.1)
Sodium: 142 mmol/L (ref 135–145)
TCO2: 28 mmol/L (ref 0–100)

## 2016-06-29 LAB — GLUCOSE, CAPILLARY: Glucose-Capillary: 117 mg/dL — ABNORMAL HIGH (ref 65–99)

## 2016-06-29 LAB — URINALYSIS, ROUTINE W REFLEX MICROSCOPIC
BILIRUBIN URINE: NEGATIVE
GLUCOSE, UA: NEGATIVE mg/dL
HGB URINE DIPSTICK: NEGATIVE
KETONES UR: NEGATIVE mg/dL
LEUKOCYTES UA: NEGATIVE
Nitrite: NEGATIVE
PH: 8 (ref 5.0–8.0)
PROTEIN: NEGATIVE mg/dL
Specific Gravity, Urine: 1.012 (ref 1.005–1.030)

## 2016-06-29 MED ORDER — LISINOPRIL 20 MG PO TABS
40.0000 mg | ORAL_TABLET | Freq: Every day | ORAL | Status: DC
  Administered 2016-06-30: 40 mg via ORAL
  Filled 2016-06-29: qty 2

## 2016-06-29 MED ORDER — ONDANSETRON HCL 4 MG/2ML IJ SOLN: 4.0000 mg | Freq: Four times a day (QID) | INTRAMUSCULAR | Status: DC | PRN

## 2016-06-29 MED ORDER — ACETAMINOPHEN 325 MG PO TABS
650.0000 mg | ORAL_TABLET | Freq: Four times a day (QID) | ORAL | Status: DC | PRN
  Administered 2016-06-30: 650 mg via ORAL
  Filled 2016-06-29: qty 2

## 2016-06-29 MED ORDER — ONDANSETRON HCL 4 MG PO TABS: 4.0000 mg | ORAL_TABLET | Freq: Four times a day (QID) | ORAL | Status: DC | PRN

## 2016-06-29 MED ORDER — INSULIN ASPART 100 UNIT/ML ~~LOC~~ SOLN
0.0000 [IU] | Freq: Three times a day (TID) | SUBCUTANEOUS | Status: DC
  Administered 2016-06-30: 2 [IU] via SUBCUTANEOUS

## 2016-06-29 MED ORDER — ACETAMINOPHEN 325 MG PO TABS
650.0000 mg | ORAL_TABLET | Freq: Once | ORAL | Status: AC
  Administered 2016-06-29: 650 mg via ORAL
  Filled 2016-06-29: qty 2

## 2016-06-29 MED ORDER — ACETAMINOPHEN 650 MG RE SUPP: 650.0000 mg | Freq: Four times a day (QID) | RECTAL | Status: DC | PRN

## 2016-06-29 MED ORDER — QUINAPRIL HCL 10 MG PO TABS: 40.0000 mg | ORAL_TABLET | Freq: Every day | ORAL | Status: DC

## 2016-06-29 MED ORDER — SODIUM CHLORIDE 0.9% FLUSH
3.0000 mL | Freq: Two times a day (BID) | INTRAVENOUS | Status: DC
  Administered 2016-06-29 – 2016-06-30 (×2): 3 mL via INTRAVENOUS

## 2016-06-29 NOTE — ED Triage Notes (Signed)
She states she tripped (didn't pass out) after having [facility] a flu [facility] today. She tripped in their parking lot. She has sm. Abrasion at forehead area and bilat. Knees. She is alert and oriented x 4 with clear speech and is in no distress.

## 2016-06-29 NOTE — ED Provider Notes (Signed)
[facility]-EMERGENCY DEPT Provider Note   CSN: [id number] Arrival date & time: 06/29/16  1545     History   Chief Complaint Chief Complaint  Patient presents with  . Fall    HPI Karen [name] is a 82 y.o. female.  HPI Patient [place] emergency department from [facility] where she [place] and fell and struck her head.  No loss consciousness.  Denies neck pain.  Patient is not on anticoagulants.  She was told to come the emergency department secondary to a hematoma of her forehead.  She denies injury to her back.  She denies upper lower extremity weakness.  No joint pain.  Ambulatory in the emergency department.  Denies nausea and vomiting. She reports moderate HA at this time without nausea or vomiting   Past Medical History:  Diagnosis Date  . Back pain   . Diabetes mellitus without complication (HCC)     There are no active problems to display for this patient.   Past Surgical History:  Procedure Laterality Date  . ABDOMINAL HYSTERECTOMY    . COLON SURGERY  2005   blockage    OB History    No data available       Home Medications    Prior to Admission medications   Medication Sig Start Date End Date Taking? Authorizing Provider  Acetaminophen (TYLENOL 8 HOUR PO) Take by mouth as needed.    Historical Provider, MD  Ascorbic Acid (VITAMIN C) 100 MG tablet Take 100 mg by mouth daily.    Historical Provider, MD  aspirin 81 MG tablet Take 81 mg by mouth daily.    Historical Provider, MD  Calcium Carbonate-Vitamin D (CALCIUM + D PO) Take by mouth.    Historical Provider, MD  glipiZIDE (GLUCOTROL XL) 5 MG 24 hr tablet Take 5 mg by mouth daily with breakfast.    Historical Provider, MD  HYDROcodone-acetaminophen (NORCO/VICODIN) 5-325 MG per tablet Take 1 tablet by mouth every 6 (six) hours as needed for moderate pain.    Historical Provider, MD  insulin lispro protamine-lispro (HUMALOG 75/25 MIX) (75-25) 100 UNIT/ML SUSP injection Inject into the skin 2 (two) times daily with a  meal.    Historical Provider, MD  liver oil-zinc oxide (DESITIN) 40 % ointment Apply 1 application topically as needed for irritation.    Historical Provider, MD  Multiple Vitamins-Minerals (MULTI COMPLETE PO) Take by mouth.    Historical Provider, MD  Omega-3 Fatty Acids (FISH OIL) 1000 MG CAPS Take by mouth.    Historical Provider, MD  quinapril (ACCUPRIL) 5 MG tablet Take 5 mg by mouth at bedtime.    Historical Provider, MD  traMADol (ULTRAM) 50 MG tablet Take 1 tablet (50 mg total) by mouth every 6 (six) hours as needed. 03/10/15   Rodolph BongEvan S Corey, MD    Family History No family history on file.  Social History Social History  Substance Use Topics  . Smoking status: Never Smoker  . Smokeless tobacco: Not on file  . Alcohol use No     Allergies   Review of patient's allergies indicates no known allergies.   Review of Systems Review of Systems  All other systems reviewed and are negative.    Physical Exam Updated Vital Signs BP 183/78 (BP Location: Left Arm)   Pulse 81   Temp 97.5 F (36.4 C) (Oral)   Resp 18   SpO2 100%   Physical Exam  Constitutional: She is oriented to person, place, and time. She appears well-developed and well-nourished.  No distress.  HENT:  Forehead hematoma with small abrasion.  No laceration.  No active bleeding.  Eyes: EOM are normal.  Neck: Normal range of motion. Neck supple.  C-spine nontender.  C-spine cleared by Nexus criteria.  Cardiovascular: Normal rate and regular rhythm.   Pulmonary/Chest: Effort normal and breath sounds normal.  Abdominal: Soft. She exhibits no distension. There is no tenderness.  Musculoskeletal: Normal range of motion.  Full range of motion bilateral hips and knees.  Full range of motion of bilateral elbows and wrists.  5 out of 5 strength in bilateral upper lower extremity major muscle groups.  Neurological: She is alert and oriented to person, place, and time.  Skin: Skin is warm and dry.  Psychiatric: She  has a normal mood and affect. Judgment normal.  Nursing note and vitals reviewed.    ED Treatments / Results  Labs (all labs ordered are listed, but only abnormal results are displayed) Labs Reviewed  CBC  I-STAT CHEM 8, ED    EKG  EKG Interpretation None       Radiology Ct Head Wo Contrast  Result Date: 06/29/2016 CLINICAL DATA:  Forehead abrasion after fall at church today. No loss of consciousness. EXAM: CT HEAD WITHOUT CONTRAST TECHNIQUE: Contiguous axial images were obtained from the base of the skull through the vertex without intravenous contrast. COMPARISON:  None. FINDINGS: Brain: No mass effect or midline shift is noted. Ventricular size is within normal limits. 8 mm hyperdensity is noted in white matter adjacent to right frontal lobe consistent with small contusion. No evidence of acute infarction or mass lesion is noted. Vascular: Atherosclerotic calcifications of internal carotid arteries are noted. Skull: Bony calvarium appears intact. Sinuses/Orbits: Visualized paranasal sinuses appear normal. Other: Small forehead scalp hematoma is noted. IMPRESSION: Small forehead scalp hematoma. Small hemorrhagic contusion seen in white matter adjacent to right frontal horn. Critical Value/emergent results were called by telephone at the time of interpretation on 06/29/2016 at 5:26 pm to Dr. Azalia Bilis , who verbally acknowledged these results. Electronically Signed   By: Lupita Raider, M.D.   On: 06/29/2016 17:27    Procedures Procedures (including critical care time)  Medications Ordered in ED Medications  acetaminophen (TYLENOL) tablet 650 mg (650 mg Oral Given 06/29/16 1718)     Initial Impression / Assessment and Plan / ED Course  I have reviewed the triage vital signs and the nursing notes.  Pertinent labs & imaging results that were available during my care of the patient were reviewed by me and considered in my medical decision making (see chart for  details).  Clinical Course    Small intracranial bleed.  Not on anticoagulants.  I discussed the case with neurosurgery, Dr. Lovell Sheehan, who agrees with overnight observation and repeat CT imaging in the morning  Final Clinical Impressions(s) / ED Diagnoses   Final diagnoses:  Intracranial bleed [facility])    New Prescriptions New Prescriptions   No medications on file     Azalia Bilis, MD 06/29/16 1751

## 2016-06-29 NOTE — H&P (Signed)
History and Physical    Karen Parsleylnora P Omahoney ZOX:[id number] DOB: 02-Feb-1930 DOA: 06/29/2016  PCP: Londell MohPHARR,WALTER DAVIDSON, MD  She also had an endocrinologist for her history of diabetes.  Patient coming from: Home  Chief Complaint: Fall  HPI: Karen [name] is a 82 y.o. woman with a history of HTN, IDDM, and osteoarthritis who feels that she was in her baseline state of health until she had a fall at church today.  She was getting out of her car in the parking lot.  She remembers closing the door.  The next thing she knew, she was on the ground, but she "could not remember how she got there".  She denies loss of consciousness.  She denies preceding aura, palpitations, headache, chest pain, shortness of breath.  She does not recall tripping on anything, but she reports that she went to her knees then "fell flat on her face".  She was advised to come to the ED by bystanders.    ED Course: She has had a slight headache since the fall, relieved with acetaminophen.  Head CT shows small hemorrhagic contusion in the white matter adjacent to the right frontal horn.  These results were reviewed with neurosurgery on-call Dr. Lovell SheehanJenkins, by the ED attending.  Observation admission has been recommended.  The patient currently denies headache or neck pain or stiffness.  No blurred or double vision.  No nausea or vomiting.  No chest pain, shortness of breath, or palpitations.  Review of Systems: As per HPI otherwise 10 point review of systems negative.    Past Medical History:  Diagnosis Date  . Back pain   . Diabetes mellitus without complication (HCC)   Chronic right arm and shoulder pain that she attributes to osteoarthritis HTN  Past Surgical History:  Procedure Laterality Date  . ABDOMINAL HYSTERECTOMY    . COLON SURGERY  2005   blockage     reports that she has never smoked. She does not have any smokeless tobacco history on file. She reports that she does not drink alcohol or use drugs.  She is  divorced.  She has two daughters.  Her daughter Clydie BraunKaren is her POA.  No Known Allergies  FAMILY HISTORY: Maternal grandmother, mother, and two sisters were all diabetics.  Prior to Admission medications   Medication Sig Start Date End Date Taking? Authorizing Provider  Ascorbic Acid (VITAMIN C) 100 MG tablet Take 100 mg by mouth daily.   Yes Historical Provider, MD  aspirin 81 MG tablet Take 81 mg by mouth daily.   Yes Historical Provider, MD  Calcium Carbonate-Vitamin D (CALCIUM + D PO) Take 1 tablet by mouth daily.    Yes Historical Provider, MD  diclofenac (VOLTAREN) 50 MG EC tablet Take 50 mg by mouth 3 (three) times daily.  06/20/16  Yes Historical Provider, MD  insulin lispro protamine-lispro (HUMALOG 75/25 MIX) (75-25) 100 UNIT/ML SUSP injection Inject 6-9 Units into the skin 2 (two) times daily with a meal. 9 units in AM and 6 units in afternoon   Yes Historical Provider, MD  Multiple Vitamins-Minerals (MULTI COMPLETE PO) Take 1 tablet by mouth daily.    Yes Historical Provider, MD  quinapril (ACCUPRIL) 40 MG tablet Take 40 mg by mouth daily. 06/22/16  Yes Historical Provider, MD    Physical Exam: Vitals:   06/29/16 1551 06/29/16 1754  BP: 183/78 170/76  Pulse: 81 74  Resp: 18 18  Temp: 97.5 F (36.4 C)   TempSrc: Oral   SpO2: 100% 100%  Constitutional: NAD, calm, comfortable, nontoxic appearing Vitals:   06/29/16 1551 06/29/16 1754  BP: 183/78 170/76  Pulse: 81 74  Resp: 18 18  Temp: 97.5 F (36.4 C)   TempSrc: Oral   SpO2: 100% 100%   Eyes: PERRL, lids and conjunctivae normal, no nystagmus ENMT: Mucous membranes are moist. Posterior pharynx clear of any exudate or lesions. She is missing several teeth. Neck: normal appearance, supple, no TTP over her cervical spine Respiratory: clear to auscultation bilaterally, no wheezing, no crackles. Normal respiratory effort. No accessory muscle use.  Cardiovascular: Normal rate, regular rhythm, no murmurs / rubs /  gallops. No extremity edema. 2+ pedal pulses. No carotid bruits.  GI: abdomen is soft and compressible.  No distention.  No tenderness.  No masses palpated.  Bowel sounds are present. Musculoskeletal:  No joint deformity in upper and lower extremities. Good ROM, no contractures. Normal muscle tone.  Skin: no rashes, warm and dry Neurologic: CN 2-12 grossly intact. Sensation intact, Strength symmetric bilaterally, 5/5.  No pronator drift. Psychiatric: Normal judgment and insight. Alert and oriented x 3. Normal mood.     Labs on Admission: I have personally reviewed following labs and imaging studies  CBC:  Recent Labs Lab 06/29/16 1801 06/29/16 1819  WBC 5.7  --   HGB 13.2 13.9  HCT 39.7 41.0  MCV 85.4  --   PLT 274  --    Basic Metabolic Panel:  Recent Labs Lab 06/29/16 1819  NA 142  K 4.1  CL 104  GLUCOSE 127*  BUN 12  CREATININE 0.80   GFR: CrCl cannot be calculated (Unknown ideal weight.).   Radiological Exams on Admission: Ct Head Wo Contrast  Result Date: 06/29/2016 CLINICAL DATA:  Forehead abrasion after fall at church today. No loss of consciousness. EXAM: CT HEAD WITHOUT CONTRAST TECHNIQUE: Contiguous axial images were obtained from the base of the skull through the vertex without intravenous contrast. COMPARISON:  None. FINDINGS: Brain: No mass effect or midline shift is noted. Ventricular size is within normal limits. 8 mm hyperdensity is noted in white matter adjacent to right frontal lobe consistent with small contusion. No evidence of acute infarction or mass lesion is noted. Vascular: Atherosclerotic calcifications of internal carotid arteries are noted. Skull: Bony calvarium appears intact. Sinuses/Orbits: Visualized paranasal sinuses appear normal. Other: Small forehead scalp hematoma is noted. IMPRESSION: Small forehead scalp hematoma. Small hemorrhagic contusion seen in white matter adjacent to right frontal horn. Critical Value/emergent results were called  by telephone at the time of interpretation on 06/29/2016 at 5:26 pm to Dr. Azalia Bilis , who verbally acknowledged these results. Electronically Signed   By: Lupita Raider, M.D.   On: 06/29/2016 17:27    EKG: PENDING.  Assessment/Plan Principal Problem:   Intracranial bleed (HCC) Active Problems:   Fall   Syncope and collapse   Diabetes (HCC)   HTN (hypertension)      Intracranial bleed secondary to fall that sounds more like syncope and collapse to me --Telemetry monitoring --12 lead EKG added to admission orders (patient already en route [facility]) --Check orthostatics upon arrival --Neurochecks --Repeat head CT in the AM (and prn for any changes in her neuro exam) --Avoid anticoagulants, NSAIDs.  Holding home aspirin, diclofenac for now. --Dr. Lovell Sheehan with Neurosurgery to see in the AM  HTN --Continue home dose of quinapril for now  DM --Hold home insulin.  Sliding scale coverage with meals for now.   DVT prophylaxis: SCDs Code Status: FULL  Family Communication: Patient's daughter Marcelino Duster present in the ED at [facility] at time of admission Disposition Plan: To be determined. Consults called: Neurosurgery, Dr. Lovell Sheehan to see in AM after repeat head CT Admission status: Observation   TIME SPENT: 55 minutes   Jerene Bears MD [facility] Pager [phone]  If 7PM-7AM, please contact night-coverage [email or web address] Password [email or web address]  06/29/2016, 7:24 PM

## 2016-06-29 NOTE — ED Notes (Signed)
Bed: [id number] Expected date: 06/29/16 Expected time: 3:39 PM Means of arrival: Ambulance Comments: Fall

## 2016-06-30 ENCOUNTER — Observation Stay (HOSPITAL_COMMUNITY): Payer: Medicare Other

## 2016-06-30 ENCOUNTER — Observation Stay (HOSPITAL_BASED_OUTPATIENT_CLINIC_OR_DEPARTMENT_OTHER): Payer: Medicare Other

## 2016-06-30 DIAGNOSIS — I10 Essential (primary) hypertension: Secondary | ICD-10-CM | POA: Diagnosis not present

## 2016-06-30 DIAGNOSIS — W19XXXA Unspecified fall, initial encounter: Secondary | ICD-10-CM | POA: Diagnosis not present

## 2016-06-30 DIAGNOSIS — R55 Syncope and collapse: Secondary | ICD-10-CM | POA: Diagnosis not present

## 2016-06-30 DIAGNOSIS — I951 Orthostatic hypotension: Secondary | ICD-10-CM

## 2016-06-30 DIAGNOSIS — I629 Nontraumatic intracranial hemorrhage, unspecified: Secondary | ICD-10-CM | POA: Diagnosis not present

## 2016-06-30 DIAGNOSIS — E119 Type 2 diabetes mellitus without complications: Secondary | ICD-10-CM | POA: Diagnosis not present

## 2016-06-30 DIAGNOSIS — E861 Hypovolemia: Secondary | ICD-10-CM

## 2016-06-30 LAB — GLUCOSE, CAPILLARY
GLUCOSE-CAPILLARY: 129 mg/dL — AB (ref 65–99)
Glucose-Capillary: 148 mg/dL — ABNORMAL HIGH (ref 65–99)
Glucose-Capillary: 107 mg/dL — ABNORMAL HIGH (ref 65–99)

## 2016-06-30 LAB — ECHOCARDIOGRAM COMPLETE
Height: 61 in
Weight: 2568 oz

## 2016-06-30 LAB — CBC
HEMATOCRIT: 41.9 % (ref 36.0–46.0)
Hemoglobin: 13.5 g/dL (ref 12.0–15.0)
MCH: 27.9 pg (ref 26.0–34.0)
MCHC: 32.2 g/dL (ref 30.0–36.0)
MCV: 86.6 fL (ref 78.0–100.0)
PLATELETS: 271 10*3/uL (ref 150–400)
RBC: 4.84 MIL/uL (ref 3.87–5.11)
RDW: 13.5 % (ref 11.5–15.5)
WBC: 5.8 10*3/uL (ref 4.0–10.5)

## 2016-06-30 MED ORDER — SODIUM CHLORIDE 0.9 % IV SOLN
INTRAVENOUS | Status: DC
  Administered 2016-06-30: 09:00:00 via INTRAVENOUS

## 2016-06-30 MED ORDER — SODIUM CHLORIDE 0.9 % IV BOLUS (SEPSIS)
1000.0000 mL | Freq: Once | INTRAVENOUS | Status: AC
  Administered 2016-06-30: 1000 mL via INTRAVENOUS

## 2016-06-30 NOTE — Progress Notes (Signed)
  Echocardiogram 2D Echocardiogram has been performed.  Karen [name], Karen [name] 06/30/2016, 10:48 AM

## 2016-06-30 NOTE — Discharge Summary (Signed)
Physician Discharge Summary  Karen [name] ZOX:[id number] DOB: [birth year] DOA: 06/29/2016  PCP: Londell Moh, MD  Admit date: 06/29/2016 Discharge date: 06/30/2016  Admitted From: Home Disposition:  Home  Recommendations for Outpatient Follow-up:  1. Follow up with PCP in 1 weeks 2. Please obtain BMP/CBC in one week 3. Please follow up on the following pending results:  Home Health: No Equipment/Devices: None  Discharge Condition: Stable CODE STATUS: Full Diet recommendation: Heart Healthy  Brief/Interim Summary: Karen [name] is a 82 y.o. woman with a history of HTN, IDDM, and osteoarthritis who feels that she was in her baseline state of health until she had a fall at church yesterday. She was getting out of her car in the parking lot and remembered closing the car door but then the next thing she knew, she was on the ground, but she "could not remember how she got there." She denied loss of consciousness and denied preceding aura, palpitations, headache, chest pain, shortness of breath. Bystanders told her to stay on the ground and advised her to come to the ED for evaluation. She had a Head CT done which showed small hemorrhagic contusion in the white matter adjacent to the right frontal horn and she was evaluated by Neurosurgery who recommended no intervention. Repeat CT Scan showed Stable focus of hemorrhage in the right frontal periventricular white matter. No evidence for new hemorrhage however did reveal dehiscence of the left lower sella turcica and opacification of posterior aspect of sphenoid sinus, question prior history of pituitary surgery. When discussed with the Neurosurgeon he was not concerned about the dehiscence of the left lower sella turcica. When talking with the patient and presenting the options she elected not to have a MRI of the Brain to work up the dehiscence. Patient was given IVF rehydration because she appeared hypovolemic and had an Echocardiogram done  prior to discharged. Orthostatic Vital Signs were assessed and within normal limits. Patient was deemed medically stable and was advised to follow up with PCP within 1 week.   Discharge Diagnoses:  Principal Problem:   Intracranial bleed [facility]) Active Problems:   Fall   Syncope and collapse   Diabetes (HCC)   HTN (hypertension)  ASSESSMENTS  1. Small stable cerebral hemorrhagic contusion in the white matter adjacent to Right Frontal Horn 2/2 to Fall, -Initial CT Scan showed Small forehead scalp hematoma and small hemorrhagic contusion seen in white matter adjacent to right frontal horn -Repeat CT Scan showed stable focus of hemorrhage in the right frontal periventricular white matter. No evidence for new hemorrhage. Dehiscence of the left lower sella turcica and opacification ofposterior aspect of sphenoid sinus, question prior history of pituitary surgery. If there is no prior history of surgery, MRI of the brain pituitary protocol is recommended. -When discussed with the patient she opted not to go with MRI of the Brain. Case was discussed with Dr. Lovell Sheehan of Neurosurgery and he was not very concerned about dehiscence. -Patient was advised to Rest and take it easy for the following week. -Patient to follow up with PCP Dr. Renne Crigler in 1 week.  2. Syncope and Collapse, likely from Orthostasis from Hypovolemia -Likely from Hypovolemia and Orthostasis -Patient was given 1 Liter of NS 0.9% bolus and was also given NS at a rate of 75 mL/hr -Orthostatic VS were good prior to D/C -Worked up with UA which showed no evidence of UTI -Echocardiogram showed Left ventricle: The cavity size was normal. Wall thickness was   increased in a  pattern of mild LVH. Systolic function was normal. The estimated ejection fraction was in the range of 50% to 55%.  Wall motion was normal; there were no regional wall motion abnormalities. Doppler parameters are consistent with abnormal left ventricular relaxation (grade  1 diastolic dysfunction). The Aortic Valve: Mildly calcified annulus. Trileaflet; normal  thickness leaflets. Valve area (VTI): 1.81 cm^2. Valve area   (Vmax): 1.98 cm^2. Valve area (Vmean): 1.88 cm^2. Left Atrium: The atrium was mildly dilated. Atrial septum: No defect or patent foramen ovale was identified.Systemic veins: IVC is small, suggesting low RA pressure and hypovolemia.  3. Hypertension -Continue Quinapril 40 mg po daily  4. Insulin Dependent Diabetes Mellitus Type 2 -Resume Home Insulin Regimin  5. Osteoarthritis -D/C'd Diclofenac; Advised to use Tylenol   Discharge Instructions  Discharge Instructions    Call MD for:  difficulty breathing, headache or visual disturbances    Complete by:  As directed    Call MD for:  persistant dizziness or light-headedness    Complete by:  As directed    Call MD for:  persistant nausea and vomiting    Complete by:  As directed    Diet - low sodium heart healthy    Complete by:  As directed    Discharge instructions    Complete by:  As directed    Follow up with your PCP within 1 week and rest this week. Take all medications as prescribed. If symptoms worsen or change please go to ER for evaluation.   Increase activity slowly    Complete by:  As directed        Medication List    STOP taking these medications   diclofenac 50 MG EC tablet Commonly known as:  VOLTAREN     TAKE these medications   aspirin 81 MG tablet Take 81 mg by mouth daily.   CALCIUM + D PO Take 1 tablet by mouth daily.   insulin lispro protamine-lispro (75-25) 100 UNIT/ML Susp injection Commonly known as:  HUMALOG 75/25 MIX Inject 6-9 Units into the skin 2 (two) times daily with a meal. 9 units in AM and 6 units in afternoon   MULTI COMPLETE PO Take 1 tablet by mouth daily.   quinapril 40 MG tablet Commonly known as:  ACCUPRIL Take 40 mg by mouth daily.   vitamin C 100 MG tablet Take 100 mg by mouth daily.      Follow-up Information     Londell Moh, MD Follow up in 1 week(s).   Specialty:  Internal Medicine Contact information: [address] [place] [address] [phone]          No Known Allergies  Consultations: -Neurosurgery Dr. Tressie Stalker  Procedures/Studies: Ct Head Wo Contrast  Result Date: 06/30/2016 CLINICAL DATA:  82 y/o F; status post fall with intracranial hemorrhage. EXAM: CT HEAD WITHOUT CONTRAST TECHNIQUE: Contiguous axial images were obtained from the base of the skull through the vertex without intravenous contrast. COMPARISON:  06/29/2016 CT head. FINDINGS: Brain: Small focus of hemorrhage in right frontal periventricular white matter is stable. No evidence for new large territory infarct, intracranial hemorrhage, focal mass effect, or hydrocephalus. Stable background of mild chronic microvascular ischemic changes and parenchymal volume loss. Vascular: No hyperdense vessel. Calcific atherosclerosis of cavernous internal carotid arteries. Skull: Frontal scalp soft tissue thickening compatible with contusion. No skull fracture identified. Sinuses/Orbits: No acute finding. Dehiscence of the left floor of the sella turcica and opacification within posterior aspect of the left sphenoid  sinus. Other: None. IMPRESSION: 1. Stable focus of hemorrhage in the right frontal periventricular white matter. No evidence for new hemorrhage. 2. Dehiscence of the left lower sella turcica and opacification of posterior aspect of sphenoid sinus, question prior history of pituitary surgery. If there is no prior history of surgery, MRI of the brain pituitary protocol is recommended. These results will be called to the ordering clinician or representative by the Radiologist Assistant, and communication documented in the PACS or zVision Dashboard. Electronically Signed   By: Mitzi Hansen M.D.   On: 06/30/2016 06:09   Ct Head Wo Contrast  Result Date: 06/29/2016 CLINICAL DATA:  Forehead  abrasion after fall at church today. No loss of consciousness. EXAM: CT HEAD WITHOUT CONTRAST TECHNIQUE: Contiguous axial images were obtained from the base of the skull through the vertex without intravenous contrast. COMPARISON:  None. FINDINGS: Brain: No mass effect or midline shift is noted. Ventricular size is within normal limits. 8 mm hyperdensity is noted in white matter adjacent to right frontal lobe consistent with small contusion. No evidence of acute infarction or mass lesion is noted. Vascular: Atherosclerotic calcifications of internal carotid arteries are noted. Skull: Bony calvarium appears intact. Sinuses/Orbits: Visualized paranasal sinuses appear normal. Other: Small forehead scalp hematoma is noted. IMPRESSION: Small forehead scalp hematoma. Small hemorrhagic contusion seen in white matter adjacent to right frontal horn. Critical Value/emergent results were called by telephone at the time of interpretation on 06/29/2016 at 5:26 pm to Dr. Azalia Bilis , who verbally acknowledged these results. Electronically Signed   By: Lupita Raider, M.D.   On: 06/29/2016 17:27     ECHOCARDIOGRAM: Left ventricle: The cavity size was normal. Wall thickness was   increased in a pattern of mild LVH. Systolic function was normal.   The estimated ejection fraction was in the range of 50% to 55%.   Wall motion was normal; there were no regional wall motion   abnormalities. Doppler parameters are consistent with abnormal   left ventricular relaxation (grade 1 diastolic dysfunction). - Aortic valve: Mildly calcified annulus. Trileaflet; normal   thickness leaflets. Valve area (VTI): 1.81 cm^2. Valve area   (Vmax): 1.98 cm^2. Valve area (Vmean): 1.88 cm^2. - Left atrium: The atrium was mildly dilated. - Atrial septum: No defect or patent foramen ovale was identified. - Systemic veins: IVC is small, suggesting low RA pressure and   hypovolemia.  Subjective: Patient was seen and examined at Bedside  today and she was doing well. Had no active complaints including N/V/Rhinorrhea, Cp, SOB or Abdominal Pain. Denied any dizziness or lightheadedness. No other complaints or concerns.   Discharge Exam: Vitals:   06/30/16 0622 06/30/16 0927  BP: (!) 143/88 (!) 149/91  Pulse: 96 78  Resp: 20 18  Temp: 97.7 F (36.5 C) 97 F (36.1 C)   Vitals:   06/30/16 0201 06/30/16 0622 06/30/16 0641 06/30/16 0927  BP: (!) 141/75 (!) 143/88  (!) 149/91  Pulse: 78 96  78  Resp: 20 20  18   Temp: 97.6 F (36.4 C) 97.7 F (36.5 C)  97 F (36.1 C)  TempSrc: Oral Oral  Oral  SpO2: 100% 100%  100%  Weight:   72.8 kg (160 lb 8 oz)   Height:        General: Pt is alert, awake, not in acute distress HEENT: Patient has small front abrasion on forehead.  Cardiovascular: RRR, S1/S2 +, no rubs, no gallops Respiratory: CTA bilaterally, no wheezing, no rhonchi Abdominal: Soft, NT,  ND, bowel sounds + Extremities: no edema, no cyanosis; Abrasion on knees bilaterally.  Neuro: CN 2-12 intact with no appreciable focal deficits.   The results of significant diagnostics from this hospitalization (including imaging, microbiology, ancillary and laboratory) are listed below for reference.    Microbiology: No results found for this or any previous visit (from the past 240 hour(s)).   Labs: BNP (last 3 results) No results for input(s): BNP in the last 8760 hours. Basic Metabolic Panel:  Recent Labs Lab 06/29/16 1819  NA 142  K 4.1  CL 104  GLUCOSE 127*  BUN 12  CREATININE 0.80   Liver Function Tests: No results for input(s): AST, ALT, ALKPHOS, BILITOT, PROT, ALBUMIN in the last 168 hours. No results for input(s): LIPASE, AMYLASE in the last 168 hours. No results for input(s): AMMONIA in the last 168 hours. CBC:  Recent Labs Lab 06/29/16 1801 06/29/16 1819 06/30/16 0615  WBC 5.7  --  5.8  HGB 13.2 13.9 13.5  HCT 39.7 41.0 41.9  MCV 85.4  --  86.6  PLT 274  --  271   Cardiac Enzymes: No  results for input(s): CKTOTAL, CKMB, CKMBINDEX, TROPONINI in the last 168 hours. BNP: Invalid input(s): POCBNP CBG:  Recent Labs Lab 06/29/16 2103 06/30/16 0545 06/30/16 0714 06/30/16 1126  GLUCAP 117* 129* 148* 107*   D-Dimer No results for input(s): DDIMER in the last 72 hours. Hgb A1c No results for input(s): HGBA1C in the last 72 hours. Lipid Profile No results for input(s): CHOL, HDL, LDLCALC, TRIG, CHOLHDL, LDLDIRECT in the last 72 hours. Thyroid function studies No results for input(s): TSH, T4TOTAL, T3FREE, THYROIDAB in the last 72 hours.  Invalid input(s): FREET3 Anemia work up No results for input(s): VITAMINB12, FOLATE, FERRITIN, TIBC, IRON, RETICCTPCT in the last 72 hours. Urinalysis    Component Value Date/Time   COLORURINE YELLOW 06/29/2016 2055   APPEARANCEUR CLEAR 06/29/2016 2055   LABSPEC 1.012 06/29/2016 2055   PHURINE 8.0 06/29/2016 2055   GLUCOSEU NEGATIVE 06/29/2016 2055   HGBUR NEGATIVE 06/29/2016 2055   BILIRUBINUR NEGATIVE 06/29/2016 2055   KETONESUR NEGATIVE 06/29/2016 2055   PROTEINUR NEGATIVE 06/29/2016 2055   NITRITE NEGATIVE 06/29/2016 2055   LEUKOCYTESUR NEGATIVE 06/29/2016 2055   Sepsis Labs Invalid input(s): PROCALCITONIN,  WBC,  LACTICIDVEN Microbiology No results found for this or any previous visit (from the past 240 hour(s)).  Time coordinating discharge: Over 30 minutes  SIGNED:  Merlene Laughtermair Latif Danil Wedge, DO [facility] 06/30/2016, 12:44 PM Pager [phone]  If 7PM-7AM, please contact night-coverage [email or web address] Password [email or web address]

## 2016-06-30 NOTE — Discharge Instructions (Signed)

## 2016-06-30 NOTE — Progress Notes (Signed)
Patient discharged home with daughter. Neuro assessment unchanged, denies any pain. Patient and family vocalized understanding of f/u appt with MD, resume daily meds. MD aware of patient's BP after bolus, states to continue with discharge. Tele removed, IV removed.

## 2016-06-30 NOTE — Consult Note (Signed)
Reason for Consult: Small right frontal intracranial hemorrhage status post fall Referring Physician: Dr. Marlou Sa is an 82 y.o. female.  HPI: The patient is an 82 year old black female who took a fall while at church yesterday. There was no loss of consciousness. The patient was brought to [facility] ER. A head scan was obtained which demonstrated a small right frontal intracranial hemorrhage. The patient was admitted for observation. A neurosurgical consultation was requested.  Presently the patient is alert and pleasant. She denies headaches, neck pain, back pain, numbness, tingling, etc. She complains of diffuse baseline arthralgias.  Past Medical History:  Diagnosis Date  . Back pain   . Diabetes mellitus without complication [facility])     Past Surgical History:  Procedure Laterality Date  . ABDOMINAL HYSTERECTOMY    . COLON SURGERY  2005   blockage    No family history on file.  Social History:  reports that she has never smoked. She has never used smokeless tobacco. She reports that she does not drink alcohol or use drugs.  Allergies: No Known Allergies  Medications:  I have reviewed the patient's current medications. Prior to Admission:  Prescriptions Prior to Admission  Medication Sig Dispense Refill Last Dose  . Ascorbic Acid (VITAMIN C) 100 MG tablet Take 100 mg by mouth daily.   06/29/2016 at Unknown time  . aspirin 81 MG tablet Take 81 mg by mouth daily.   06/29/2016 at Unknown time  . Calcium Carbonate-Vitamin D (CALCIUM + D PO) Take 1 tablet by mouth daily.    06/29/2016 at Unknown time  . diclofenac (VOLTAREN) 50 MG EC tablet Take 50 mg by mouth 3 (three) times daily.    Past Week at Unknown time  . insulin lispro protamine-lispro (HUMALOG 75/25 MIX) (75-25) 100 UNIT/ML SUSP injection Inject 6-9 Units into the skin 2 (two) times daily with a meal. 9 units in AM and 6 units in afternoon   06/29/2016 at Unknown time  . Multiple Vitamins-Minerals (MULTI  COMPLETE PO) Take 1 tablet by mouth daily.    06/29/2016 at Unknown time  . quinapril (ACCUPRIL) 40 MG tablet Take 40 mg by mouth daily.   06/29/2016 at Unknown time   Scheduled: . insulin aspart  0-15 Units Subcutaneous TID WC  . lisinopril  40 mg Oral Daily  . sodium chloride flush  3 mL Intravenous Q12H   Continuous: . sodium chloride     JWJ:[id number] **OR** acetaminophen, ondansetron **OR** ondansetron (ZOFRAN) IV Anti-infectives    None       Results for orders placed or performed during the hospital encounter of 06/29/16 (from the past 48 hour(s))  CBC     Status: None   Collection Time: 06/29/16  6:01 PM  Result Value Ref Range   WBC 5.7 4.0 - 10.5 K/uL   RBC 4.65 3.87 - 5.11 MIL/uL   Hemoglobin 13.2 12.0 - 15.0 g/dL   HCT 08.6 57.8 - 46.9 %   MCV 85.4 78.0 - 100.0 fL   MCH 28.4 26.0 - 34.0 pg   MCHC 33.2 30.0 - 36.0 g/dL   RDW 62.9 52.8 - 41.3 %   Platelets 274 150 - 400 K/uL  I-Stat Chem 8, ED     Status: Abnormal   Collection Time: 06/29/16  6:19 PM  Result Value Ref Range   Sodium 142 135 - 145 mmol/L   Potassium 4.1 3.5 - 5.1 mmol/L   Chloride 104 101 - 111 mmol/L   BUN  12 6 - 20 mg/dL   Creatinine, Ser 2.130.80 0.44 - 1.00 mg/dL   Glucose, Bld 086127 (H) 65 - 99 mg/dL   Calcium, Ion 5.781.19 4.691.15 - 1.40 mmol/L   TCO2 28 0 - 100 mmol/L   Hemoglobin 13.9 12.0 - 15.0 g/dL   HCT 62.941.0 52.836.0 - 41.346.0 %  Urinalysis, Routine w reflex microscopic (not at [facility])     Status: None   Collection Time: 06/29/16  8:55 PM  Result Value Ref Range   Color, Urine YELLOW YELLOW   APPearance CLEAR CLEAR   Specific Gravity, Urine 1.012 1.005 - 1.030   pH 8.0 5.0 - 8.0   Glucose, UA NEGATIVE NEGATIVE mg/dL   Hgb urine dipstick NEGATIVE NEGATIVE   Bilirubin Urine NEGATIVE NEGATIVE   Ketones, ur NEGATIVE NEGATIVE mg/dL   Protein, ur NEGATIVE NEGATIVE mg/dL   Nitrite NEGATIVE NEGATIVE   Leukocytes, UA NEGATIVE NEGATIVE    Comment: MICROSCOPIC NOT DONE ON URINES WITH NEGATIVE PROTEIN,  BLOOD, LEUKOCYTES, NITRITE, OR GLUCOSE <1000 mg/dL.  Glucose, capillary     Status: Abnormal   Collection Time: 06/29/16  9:03 PM  Result Value Ref Range   Glucose-Capillary 117 (H) 65 - 99 mg/dL   Comment 1 Notify RN    Comment 2 Document in Chart   Glucose, capillary     Status: Abnormal   Collection Time: 06/30/16  5:45 AM  Result Value Ref Range   Glucose-Capillary 129 (H) 65 - 99 mg/dL   Comment 1 Notify RN    Comment 2 Document in Chart   CBC     Status: None   Collection Time: 06/30/16  6:15 AM  Result Value Ref Range   WBC 5.8 4.0 - 10.5 K/uL   RBC 4.84 3.87 - 5.11 MIL/uL   Hemoglobin 13.5 12.0 - 15.0 g/dL   HCT 24.441.9 01.036.0 - 27.246.0 %   MCV 86.6 78.0 - 100.0 fL   MCH 27.9 26.0 - 34.0 pg   MCHC 32.2 30.0 - 36.0 g/dL   RDW 53.613.5 64.411.5 - 03.415.5 %   Platelets 271 150 - 400 K/uL  Glucose, capillary     Status: Abnormal   Collection Time: 06/30/16  7:14 AM  Result Value Ref Range   Glucose-Capillary 148 (H) 65 - 99 mg/dL   Comment 1 Notify RN    Comment 2 Document in Chart     Ct Head Wo Contrast  Result Date: 06/30/2016 CLINICAL DATA:  82 y/o F; status post fall with intracranial hemorrhage. EXAM: CT HEAD WITHOUT CONTRAST TECHNIQUE: Contiguous axial images were obtained from the base of the skull through the vertex without intravenous contrast. COMPARISON:  06/29/2016 CT head. FINDINGS: Brain: Small focus of hemorrhage in right frontal periventricular white matter is stable. No evidence for new large territory infarct, intracranial hemorrhage, focal mass effect, or hydrocephalus. Stable background of mild chronic microvascular ischemic changes and parenchymal volume loss. Vascular: No hyperdense vessel. Calcific atherosclerosis of cavernous internal carotid arteries. Skull: Frontal scalp soft tissue thickening compatible with contusion. No skull fracture identified. Sinuses/Orbits: No acute finding. Dehiscence of the left floor of the sella turcica and opacification within posterior  aspect of the left sphenoid sinus. Other: None. IMPRESSION: 1. Stable focus of hemorrhage in the right frontal periventricular white matter. No evidence for new hemorrhage. 2. Dehiscence of the left lower sella turcica and opacification of posterior aspect of sphenoid sinus, question prior history of pituitary surgery. If there is no prior history of surgery, MRI of the  brain pituitary protocol is recommended. These results will be called to the ordering clinician or representative by the Radiologist Assistant, and communication documented in the PACS or zVision Dashboard. Electronically Signed   By: Mitzi Hansen M.D.   On: 06/30/2016 06:09   Ct Head Wo Contrast  Result Date: 06/29/2016 CLINICAL DATA:  Forehead abrasion after fall at church today. No loss of consciousness. EXAM: CT HEAD WITHOUT CONTRAST TECHNIQUE: Contiguous axial images were obtained from the base of the skull through the vertex without intravenous contrast. COMPARISON:  None. FINDINGS: Brain: No mass effect or midline shift is noted. Ventricular size is within normal limits. 8 mm hyperdensity is noted in white matter adjacent to right frontal lobe consistent with small contusion. No evidence of acute infarction or mass lesion is noted. Vascular: Atherosclerotic calcifications of internal carotid arteries are noted. Skull: Bony calvarium appears intact. Sinuses/Orbits: Visualized paranasal sinuses appear normal. Other: Small forehead scalp hematoma is noted. IMPRESSION: Small forehead scalp hematoma. Small hemorrhagic contusion seen in white matter adjacent to right frontal horn. Critical Value/emergent results were called by telephone at the time of interpretation on 06/29/2016 at 5:26 pm to Dr. Azalia Bilis , who verbally acknowledged these results. Electronically Signed   By: Lupita Raider, M.D.   On: 06/29/2016 17:27    ROS: As above, she complains only of bilateral knee soreness status post her fall. Blood pressure (!)  143/88, pulse 96, temperature 97.7 F (36.5 C), temperature source Oral, resp. rate 20, height 5\' 1"  (1.549 m), weight 72.8 kg (160 lb 8 oz), SpO2 100 %. Physical Exam  General: An alert and pleasant 82 year old black female in no apparent distress.  HEENT normocephalic, atraumatic, extraocular muscles intact, there is no evidence of CSF otorrhea or rhinorrhea.  Neck: Supple with a age-appropriate normal range of motion.  Thorax: Symmetric  Abdomen: Soft  Extremities: She has some abrasions on her bilateral knees and has ice packs in place.  Neurologic exam: The patient is alert and oriented 3. Glasgow Coma Scale 15. Cranial nerves II through XII were examined bilaterally and grossly normal. Vision and hearing are grossly normal bilaterally. Motor strength is 5 over 5 in her bowel bicep, tricep, hand grip, quadriceps, gastrocnemius, dorsiflexors. Sensory function is intact to light touch sensation all tested dermatomes bilaterally.  I reviewed the patient's head CT performed yesterday at [facility] and today and [facility]. She has a small right frontal hyperdensity consistent with a small contusion. There is no significant mass effect.  Assessment/Plan: Cerebral contusion: I discussed the situation with the patient and her daughter. I told that this lesion should resolve with time and requires no further intervention. I have answered all her questions. She can follow-up with her primary doctor as needed.  Karen [name] D 06/30/2016, 9:10 AM

## 2016-07-03 LAB — GLUCOSE, CAPILLARY: Glucose-Capillary: 90 mg/dL (ref 65–99)

## 2016-07-22 ENCOUNTER — Ambulatory Visit
Admission: RE | Admit: 2016-07-22 | Discharge: 2016-07-22 | Disposition: A | Discharge status: Home / Self Care | Payer: Medicare Other | Source: Ambulatory Visit | Attending: Internal Medicine | Admitting: Internal Medicine

## 2016-07-22 DIAGNOSIS — Z1231 Encounter for screening mammogram for malignant neoplasm of breast: Secondary | ICD-10-CM

## 2016-11-08 DIAGNOSIS — M8580 Other specified disorders of bone density and structure, unspecified site: Secondary | ICD-10-CM | POA: Insufficient documentation

## 2016-11-08 DIAGNOSIS — E119 Type 2 diabetes mellitus without complications: Secondary | ICD-10-CM | POA: Insufficient documentation

## 2016-11-08 DIAGNOSIS — E782 Mixed hyperlipidemia: Secondary | ICD-10-CM | POA: Insufficient documentation

## 2017-01-29 ENCOUNTER — Encounter: Payer: Self-pay | Admitting: Podiatry

## 2017-01-29 ENCOUNTER — Ambulatory Visit (INDEPENDENT_AMBULATORY_CARE_PROVIDER_SITE_OTHER): Payer: Medicare Other | Admitting: Podiatry

## 2017-01-29 VITALS — BP 138/74 | HR 68

## 2017-01-29 DIAGNOSIS — L84 Corns and callosities: Secondary | ICD-10-CM

## 2017-01-29 DIAGNOSIS — R0989 Other specified symptoms and signs involving the circulatory and respiratory systems: Secondary | ICD-10-CM | POA: Diagnosis not present

## 2017-01-29 DIAGNOSIS — E119 Type 2 diabetes mellitus without complications: Secondary | ICD-10-CM | POA: Diagnosis not present

## 2017-01-29 NOTE — Progress Notes (Signed)
   Subjective:    Patient ID: Karen [name], female    DOB: 03-22-1930, 83 y.o.   MRN: [id number]  HPI This patient presents today requesting diabetic foot exam. She also states that the corns on the hallux and second toes have improved since she applies Vaseline and Band-Aids to the area. Patient is diabetic estimated 20-30 years. Denies history of foot ulceration, amputation occasion Denies history of smoking   Review of Systems  All other systems reviewed and are negative.      Objective:   Physical Exam  Orientated 3  Vascular: Mild peripheral pitting edema bilaterally DP pulses 2/4 bilaterally PT pulses 1/4 bilaterally Capillary reflex immediate bilaterally  Neurological: Sensation to 10 g monofilament wire intact 5/5 bilaterally Vibratory sensation reactive bilaterally Ankle reflexes reactive bilaterally  Dermatological: No open skin lesions bilaterally Atrophic skin with absent hair growth bilaterally Small keratoses lateral hallux and medial second toes bilaterally  Musculoskeletal: Pes planus bilaterally Transverse medial deviation second toes bilaterally Manual motor testing dorsi flexion, plantar flexion, inversion, eversion 5/5 bilaterally       Assessment & Plan:   Assessment: Diabetic with protective sensation intact Decrease posterior tibial pulses bilaterally Mild keratoses hallux second toes bilaterally Transverse plane deformity second MPJ bilaterally  Plan: Today I reviewed the results of the exam with patient today. We discussed general diabetic foot care. Also suggested silicone toe wedge to insert between hallux and second toes as needed to reduce discomfort If she does not like the silicone wedge she could use Vaseline and Band-Aids to the hallux and second toes as needed   Reappoint as needed or yearly

## 2017-01-29 NOTE — Patient Instructions (Signed)
Purchase silicone toe separator similar to what I demonstrated in the treatment room to insert a between the great toe and second toe to reduce friction rub. Also, if you do not find a toe separator comfortable okay to apply Vaseline and Band-Aids to small corns on great toe second toe as needed Return as needed or yearly  Diabetes and Foot Care Diabetes may cause you to have problems because of poor blood supply (circulation) to your feet and legs. This may cause the skin on your feet to become thinner, break easier, and heal more slowly. Your skin may become dry, and the skin may peel and crack. You may also have nerve damage in your legs and feet causing decreased feeling in them. You may not notice minor injuries to your feet that could lead to infections or more serious problems. Taking care of your feet is one of the most important things you can do for yourself. Follow these instructions at home:  Wear shoes at all times, even in the house. Do not go barefoot. Bare feet are easily injured.  Check your feet daily for blisters, cuts, and redness. If you cannot see the bottom of your feet, use a mirror or ask someone for help.  Wash your feet with warm water (do not use hot water) and mild soap. Then pat your feet and the areas between your toes until they are completely dry. Do not soak your feet as this can dry your skin.  Apply a moisturizing lotion or petroleum jelly (that does not contain alcohol and is unscented) to the skin on your feet and to dry, brittle toenails. Do not apply lotion between your toes.  Trim your toenails straight across. Do not dig under them or around the cuticle. File the edges of your nails with an emery board or nail file.  Do not cut corns or calluses or try to remove them with medicine.  Wear clean socks or stockings every day. Make sure they are not too tight. Do not wear knee-high stockings since they may decrease blood flow to your legs.  Wear shoes that  fit properly and have enough cushioning. To break in new shoes, wear them for just a few hours a day. This prevents you from injuring your feet. Always look in your shoes before you put them on to be sure there are no objects inside.  Do not cross your legs. This may decrease the blood flow to your feet.  If you find a minor scrape, cut, or break in the skin on your feet, keep it and the skin around it clean and dry. These areas may be cleansed with mild soap and water. Do not cleanse the area with peroxide, alcohol, or iodine.  When you remove an adhesive bandage, be sure not to damage the skin around it.  If you have a wound, look at it several times a day to make sure it is healing.  Do not use heating pads or hot water bottles. They may burn your skin. If you have lost feeling in your feet or legs, you may not know it is happening until it is too late.  Make sure your health care provider performs a complete foot exam at least annually or more often if you have foot problems. Report any cuts, sores, or bruises to your health care provider immediately. Contact a health care provider if:  You have an injury that is not healing.  You have cuts or breaks in the skin.  You have an ingrown nail.  You notice redness on your legs or feet.  You feel burning or tingling in your legs or feet.  You have pain or cramps in your legs and feet.  Your legs or feet are numb.  Your feet always feel cold. Get help right away if:  There is increasing redness, swelling, or pain in or around a wound.  There is a red line that goes up your leg.  Pus is coming from a wound.  You develop a fever or as directed by your health care provider.  You notice a bad smell coming from an ulcer or wound. This information is not intended to replace advice given to you by your health care provider. Make sure you discuss any questions you have with your health care provider. Document Released: 09/13/2000  Document Revised: 02/22/2016 Document Reviewed: 02/23/2013 Elsevier Interactive Patient Education  2017 [employer].

## 2017-06-18 ENCOUNTER — Other Ambulatory Visit: Payer: Self-pay | Admitting: Internal Medicine

## 2017-06-18 DIAGNOSIS — Z1231 Encounter for screening mammogram for malignant neoplasm of breast: Secondary | ICD-10-CM

## 2017-07-23 ENCOUNTER — Ambulatory Visit
Admission: RE | Admit: 2017-07-23 | Discharge: 2017-07-23 | Disposition: A | Discharge status: Home / Self Care | Payer: Medicare Other | Source: Ambulatory Visit | Attending: Internal Medicine | Admitting: Internal Medicine

## 2017-07-23 DIAGNOSIS — Z1231 Encounter for screening mammogram for malignant neoplasm of breast: Secondary | ICD-10-CM

## 2017-09-21 IMAGING — CT CT HEAD W/O CM
3 of 4 series · 17 of 47 positions shown, 20 images · non-contrast
Comparison: 06/29/2016 CT head.

CLINICAL DATA: 85 y/o F; status post fall with intracranial
hemorrhage.

EXAM:
CT HEAD WITHOUT CONTRAST
TECHNIQUE: Contiguous axial images were obtained from the base of the skull
through the vertex without intravenous contrast.

[Series 201: head w/o, idose (1) · axial · non-contrast · 0.40mm/px · z∈[+101,+231]mm · 11 of 32 slices shown, 14 images]
[im 3/32  brain]
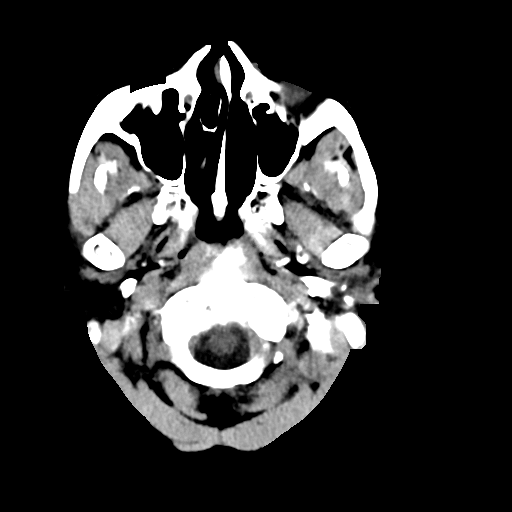
[im 3/32  bone]
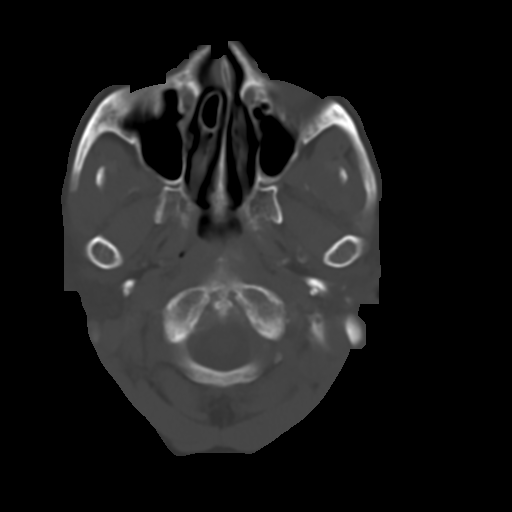
[im 5/32  brain]
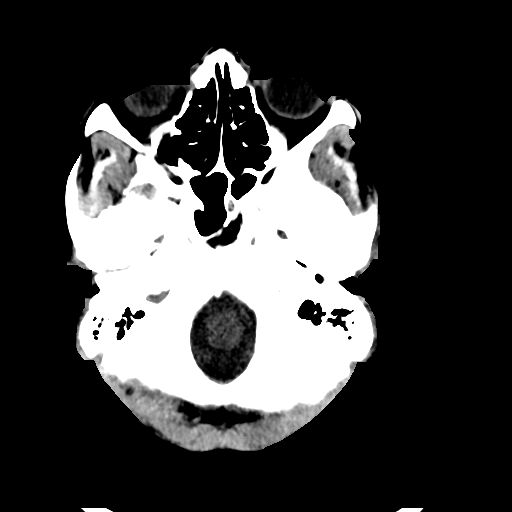
[im 7/32  brain]
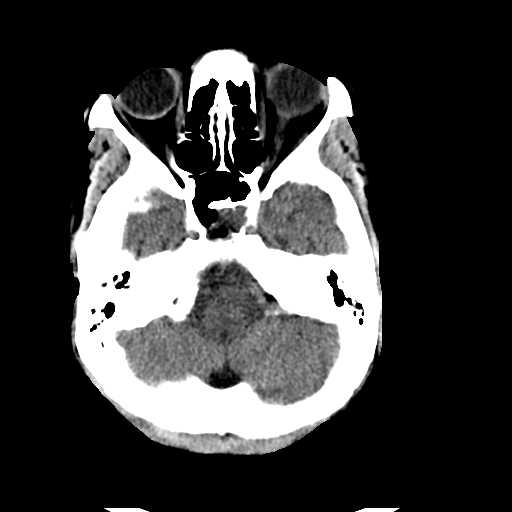
[im 12/32  brain]
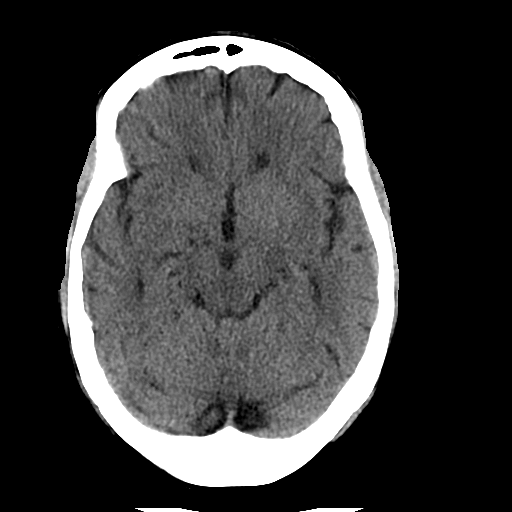
[im 14/32  brain]
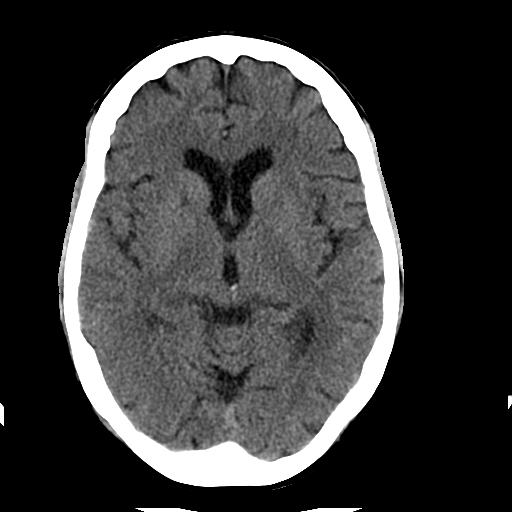
[im 14/32  bone]
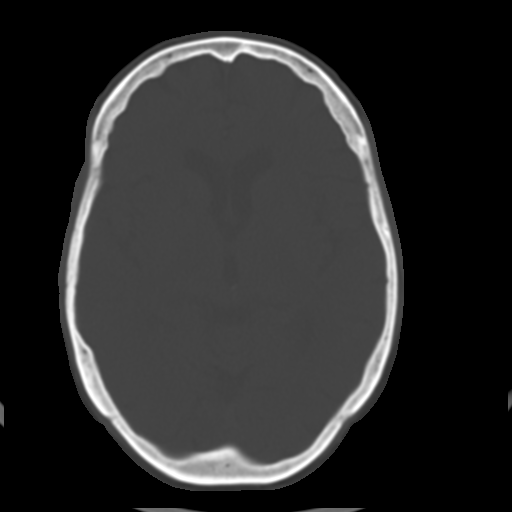
[im 16/32  brain]
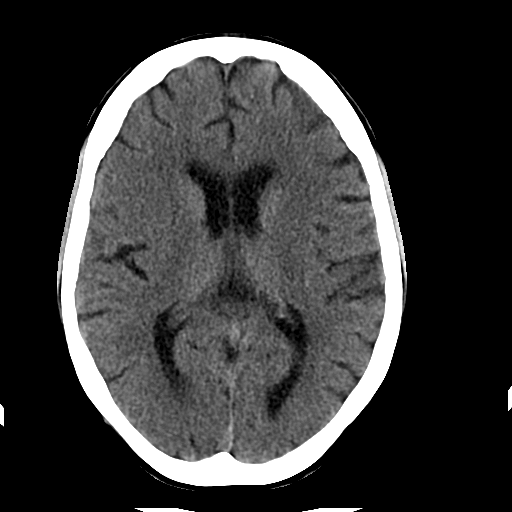
[im 18/32  brain]
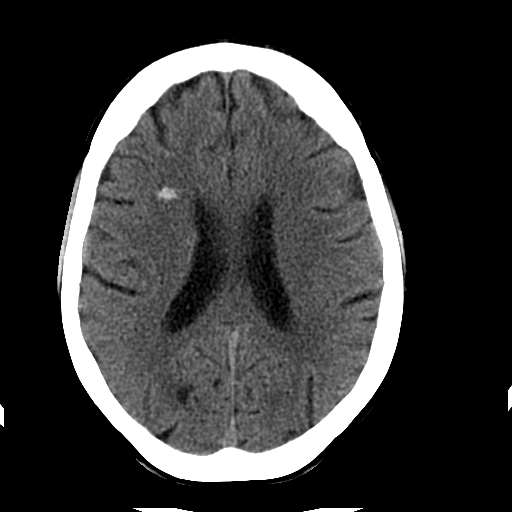
[im 20/32  brain]
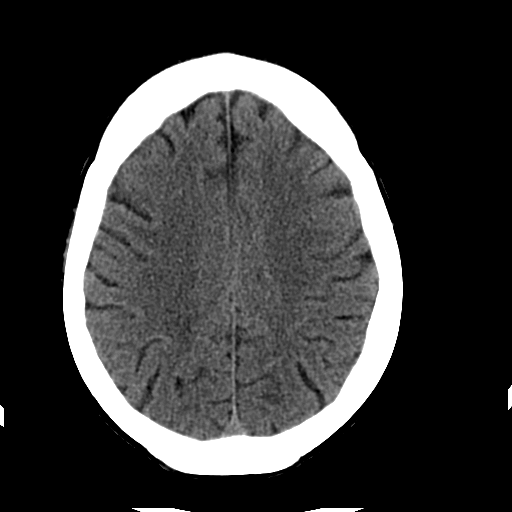
[im 25/32  brain]
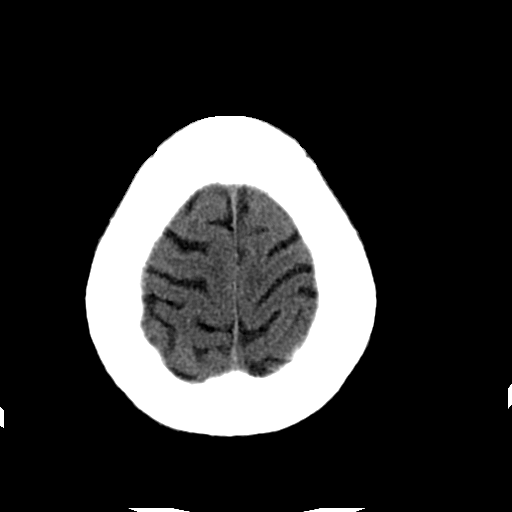
[im 25/32  bone]
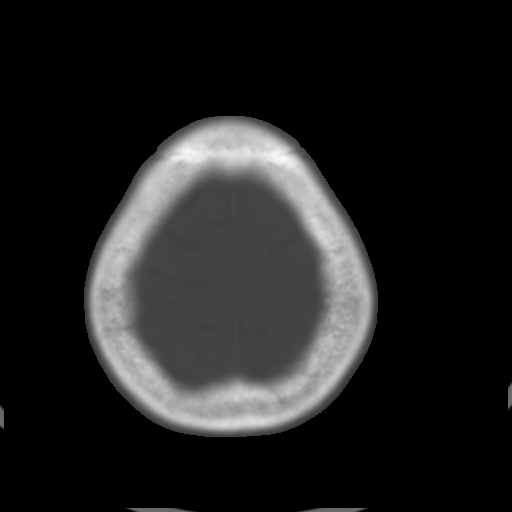
[im 27/32  brain]
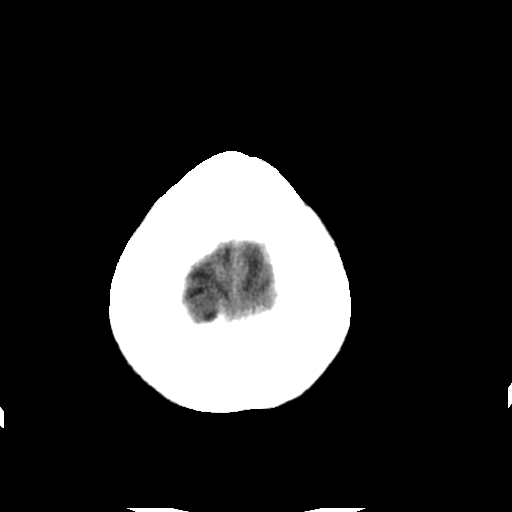
[im 29/32  brain]
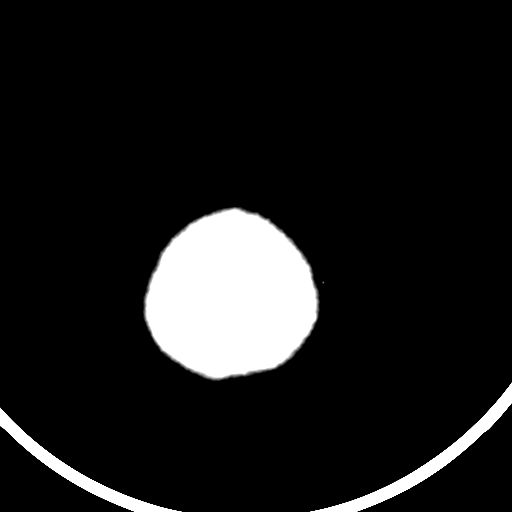

[Series 203: coronal st, idose (1) · coronal · 0.39mm/px · 3 of 67 slices shown]
[im 23/67  brain]
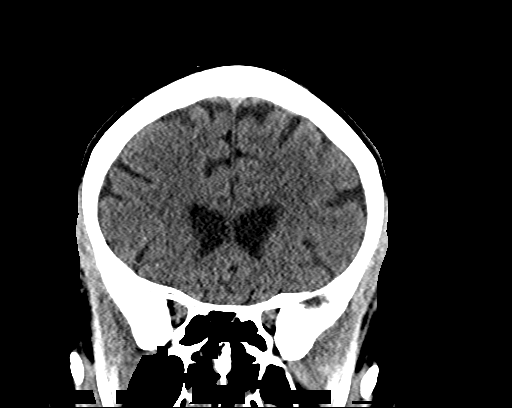
[im 30/67  brain]
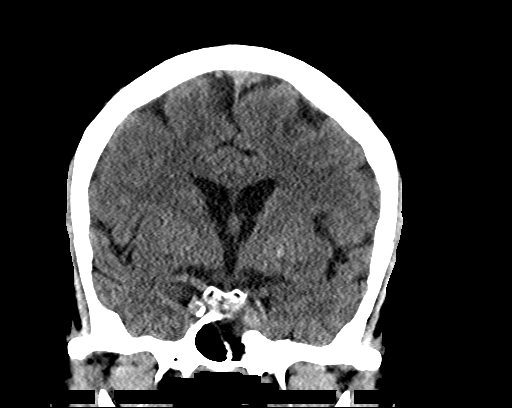
[im 37/67  brain]
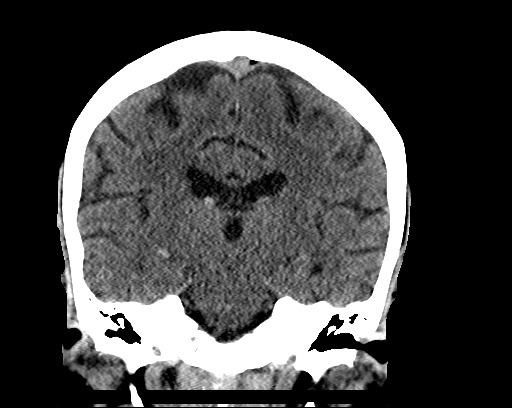

[Series 204: sagittal st, idose (1) · sagittal · 0.39mm/px · 3 of 66 slices shown]
[im 22/66  brain]
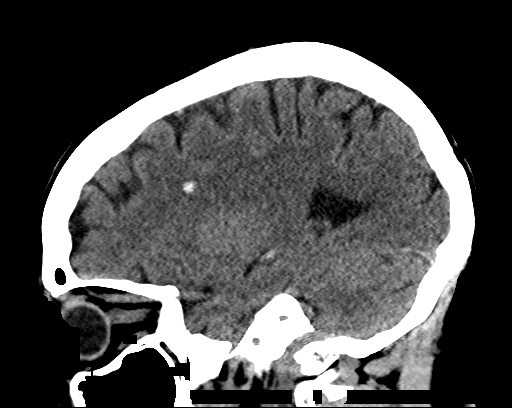
[im 33/66  brain]
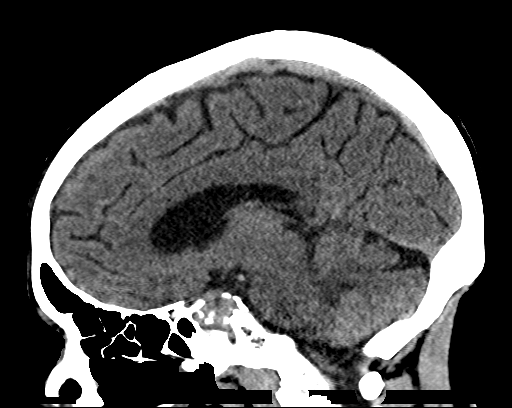
[im 44/66  brain]
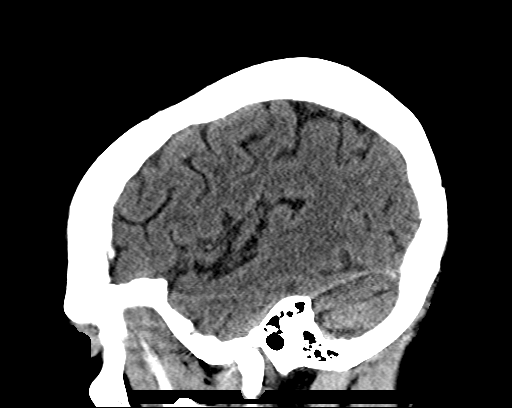

[17 of 47 positions shown; findings below may reference images not displayed]

FINDINGS: Brain: Small focus of hemorrhage in right frontal periventricular
white matter is stable. No evidence for new large territory infarct,
intracranial hemorrhage, focal mass effect, or hydrocephalus. Stable
background of mild chronic microvascular ischemic changes and
parenchymal volume loss.

Vascular: No hyperdense vessel. Calcific atherosclerosis of
cavernous internal carotid arteries.

Skull: Frontal scalp soft tissue thickening compatible with
contusion. No skull fracture identified.

Sinuses/Orbits: No acute finding. Dehiscence of the left floor of
the sella turcica and opacification within posterior aspect of the
left sphenoid sinus.

Other: None.
IMPRESSION: 1. Stable focus of hemorrhage in the right frontal periventricular
white matter. No evidence for new hemorrhage.
2. Dehiscence of the left lower sella turcica and opacification of
posterior aspect of sphenoid sinus, question prior history of
pituitary surgery. If there is no prior history of surgery, MRI of
the brain pituitary protocol is recommended.
These results will be called to the ordering clinician or
representative by the Radiologist Assistant, and communication
documented in the PACS or zVision Dashboard.

By: Woody Jumper M.D.

## 2017-11-08 ENCOUNTER — Emergency Department (HOSPITAL_COMMUNITY)
Admission: EM | Admit: 2017-11-08 | Discharge: 2017-11-08 | Disposition: A | Discharge status: Home / Self Care | Payer: Medicare Other | Attending: Emergency Medicine | Admitting: Emergency Medicine

## 2017-11-08 ENCOUNTER — Emergency Department (HOSPITAL_COMMUNITY): Payer: Medicare Other

## 2017-11-08 ENCOUNTER — Encounter (HOSPITAL_COMMUNITY): Payer: Self-pay

## 2017-11-08 DIAGNOSIS — Z794 Long term (current) use of insulin: Secondary | ICD-10-CM | POA: Insufficient documentation

## 2017-11-08 DIAGNOSIS — R1013 Epigastric pain: Secondary | ICD-10-CM | POA: Insufficient documentation

## 2017-11-08 DIAGNOSIS — I10 Essential (primary) hypertension: Secondary | ICD-10-CM | POA: Diagnosis not present

## 2017-11-08 DIAGNOSIS — E119 Type 2 diabetes mellitus without complications: Secondary | ICD-10-CM | POA: Diagnosis not present

## 2017-11-08 DIAGNOSIS — R079 Chest pain, unspecified: Secondary | ICD-10-CM | POA: Diagnosis present

## 2017-11-08 DIAGNOSIS — R0789 Other chest pain: Secondary | ICD-10-CM | POA: Insufficient documentation

## 2017-11-08 DIAGNOSIS — Z7982 Long term (current) use of aspirin: Secondary | ICD-10-CM | POA: Diagnosis not present

## 2017-11-08 LAB — CBC
HEMATOCRIT: 38 % (ref 36.0–46.0)
HEMOGLOBIN: 12.7 g/dL (ref 12.0–15.0)
MCH: 28.2 pg (ref 26.0–34.0)
MCHC: 33.4 g/dL (ref 30.0–36.0)
MCV: 84.3 fL (ref 78.0–100.0)
Platelets: 262 10*3/uL (ref 150–400)
RBC: 4.51 MIL/uL (ref 3.87–5.11)
RDW: 14.1 % (ref 11.5–15.5)
WBC: 6 10*3/uL (ref 4.0–10.5)

## 2017-11-08 LAB — BASIC METABOLIC PANEL
ANION GAP: 5 (ref 5–15)
BUN: 17 mg/dL (ref 6–20)
CO2: 29 mmol/L (ref 22–32)
Calcium: 9.1 mg/dL (ref 8.9–10.3)
Chloride: 105 mmol/L (ref 101–111)
Creatinine, Ser: 0.56 mg/dL (ref 0.44–1.00)
GFR calc Af Amer: >60 mL/min (ref >60)
Glucose, Bld: 129 mg/dL — ABNORMAL HIGH (ref 65–99)
POTASSIUM: 3.9 mmol/L (ref 3.5–5.1)
SODIUM: 139 mmol/L (ref 135–145)

## 2017-11-08 LAB — I-STAT TROPONIN, ED
TROPONIN I, POC: 0 ng/mL (ref 0.00–0.08)
Troponin i, poc: 0 ng/mL (ref 0.00–0.08)

## 2017-11-08 NOTE — ED Notes (Signed)
Pt had drawn for labs:  Gold Red Lavender Lt breen Blue Dark green x2

## 2017-11-08 NOTE — ED Triage Notes (Signed)
Pt with chest pain off/on x 1 week.  No shortness of breath or cough.  No recent physical activity.  Not worse with movement or eating.

## 2017-11-08 NOTE — Discharge Instructions (Addendum)
Both of your TROPONIN lab tests were negative today. Follow-up with your primary care provider for further evaluation. Continue your home medications as previously prescribed. Return to ED for worsening symptoms, severe chest pain, shortness of breath, coughing up blood, head injuries or falls.

## 2017-11-08 NOTE — ED Provider Notes (Signed)
[facility]-EMERGENCY DEPT Provider Note   CSN: [id number] Arrival date & time: 11/08/17  4098     History   Chief Complaint Chief Complaint  Patient presents with  . Chest Pain    HPI Karen [name] is a 84 y.o. female with a past medical history of hypertension, diabetes, who presents to ED for evaluation of one-week history of intermittent chest pain described as "feeling gassy."  States that it happened a few times last week but began severely this morning.  It has resided since arrival in the ED, rates it at 2/10. Unsure if this was caused by the fish sandwich she ate at a restaurant last night.  She took Alka-Seltzer with no improvement in her symptoms.  She tells me that it feels like "I need to burp."  No previous history of similar symptoms in the past.  She denies any vomiting, shortness of breath, fever, cough, hemoptysis, leg swelling, recent URI illness, recent surgeries, recent prolonged travel or immobilization, prior MI, DVT or PE, history of cancer.  She lives at home alone but reports compliance with her home medications.  She states that she works a job 2 hours daily and is constantly walking around on her feet.  Denies any recent injuries or falls.  HPI  Past Medical History:  Diagnosis Date  . Back pain   . Diabetes mellitus without complication [facility])     Patient Active Problem List   Diagnosis Date Noted  . Intracranial bleed (HCC) 06/29/2016  . Fall 06/29/2016  . Syncope and collapse 06/29/2016  . Diabetes (HCC) 06/29/2016  . HTN (hypertension) 06/29/2016    Past Surgical History:  Procedure Laterality Date  . ABDOMINAL HYSTERECTOMY    . COLON SURGERY  2005   blockage    OB History    No data available       Home Medications    Prior to Admission medications   Medication Sig Start Date End Date Taking? Authorizing Provider  Ascorbic Acid (VITAMIN C) 100 MG tablet Take 100 mg by mouth daily.    [provider]    aspirin 81 MG tablet Take 81 mg by mouth daily.    [provider]  Calcium Carbonate-Vitamin D (CALCIUM + D PO) Take 1 tablet by mouth daily.     [provider]  insulin lispro protamine-lispro (HUMALOG 75/25 MIX) (75-25) 100 UNIT/ML SUSP injection Inject 6-9 Units into the skin 2 (two) times daily with a meal. 9 units in AM and 6 units in afternoon    [provider]  Multiple Vitamins-Minerals (MULTI COMPLETE PO) Take 1 tablet by mouth daily.     [provider]  quinapril (ACCUPRIL) 40 MG tablet Take 40 mg by mouth daily. 06/22/16   [provider]    Family History History reviewed. No pertinent family history.  Social History Social History   Tobacco Use  . Smoking status: Never Smoker  . Smokeless tobacco: Never Used  Substance Use Topics  . Alcohol use: No  . Drug use: No     Allergies   Patient has no known allergies.   Review of Systems Review of Systems  Constitutional: Negative for appetite change, chills and fever.  HENT: Negative for ear pain, rhinorrhea, sneezing and sore throat.   Eyes: Negative for photophobia and visual disturbance.  Respiratory: Negative for cough, chest tightness, shortness of breath and wheezing.   Cardiovascular: Positive for chest pain. Negative for palpitations.  Gastrointestinal: Negative for  abdominal pain, blood in stool, constipation, diarrhea, nausea and vomiting.       + "gassy"  Genitourinary: Negative for dysuria, hematuria and urgency.  Musculoskeletal: Negative for myalgias.  Skin: Negative for rash.  Neurological: Negative for dizziness, weakness and light-headedness.     Physical Exam Updated Vital Signs BP (!) 188/86 (BP Location: Left Arm)   Pulse 68   Temp 98 F (36.7 C) (Oral)   Resp 20   SpO2 100%   Physical Exam  Constitutional: She appears well-developed and well-nourished. No distress.  Nontoxic appearing and in no acute distress.  Resting comfortably on  exam bed.  Pleasant.  HENT:  Head: Normocephalic and atraumatic.  Nose: Nose normal.  Eyes: Conjunctivae and EOM are normal. Right eye exhibits no discharge. Left eye exhibits no discharge. No scleral icterus.  Neck: Normal range of motion. Neck supple.  Cardiovascular: Normal rate, regular rhythm, normal heart sounds and intact distal pulses. Exam reveals no gallop and no friction rub.  No murmur heard. Pulmonary/Chest: Effort normal and breath sounds normal. No respiratory distress.  No chest tenderness to palpation. Lungs CTAB.  Abdominal: Soft. Bowel sounds are normal. She exhibits no distension. There is no tenderness. There is no guarding.  Musculoskeletal: Normal range of motion. She exhibits no edema.  No lower extremity edema, erythema or calf tenderness bilaterally.  Neurological: She is alert. She exhibits normal muscle tone. Coordination normal.  Skin: Skin is warm and dry. No rash noted.  Psychiatric: She has a normal mood and affect.  Nursing note and vitals reviewed.    ED Treatments / Results  Labs (all labs ordered are listed, but only abnormal results are displayed) Labs Reviewed  BASIC METABOLIC PANEL - Abnormal; Notable for the following components:      Result Value   Glucose, Bld 129 (*)    All other components within normal limits  CBC  I-STAT TROPONIN, ED  I-STAT TROPONIN, ED    EKG  EKG Interpretation  Date/Time:  Saturday November 08 2017 10:08:43 EST Ventricular Rate:  72 PR Interval:    QRS Duration: 82 QT Interval:  399 QTC Calculation: 437 R Axis:   79 Text Interpretation:  Sinus rhythm Baseline wander in lead(s) V6 No significant change was found Confirmed by Azalia Bilis ([id number]) on 11/08/2017 2:09:23 PM       Radiology Dg Chest 2 View  Result Date: 11/08/2017 CLINICAL DATA:  Acute chest pain for 1 day. EXAM: CHEST  2 VIEW COMPARISON:  07/05/2007 chest radiograph FINDINGS: The cardiomediastinal silhouette is unremarkable. There is no  evidence of focal airspace disease, pulmonary edema, suspicious pulmonary nodule/mass, pleural effusion, or pneumothorax. No acute bony abnormalities are identified. IMPRESSION: No active cardiopulmonary disease. Electronically Signed   By: Harmon Pier M.D.   On: 11/08/2017 10:53    Procedures Procedures (including critical care time)  Medications Ordered in ED Medications - No data to display   Initial Impression / Assessment and Plan / ED Course  I have reviewed the triage vital signs and the nursing notes.  Pertinent labs & imaging results that were available during my care of the patient were reviewed by me and considered in my medical decision making (see chart for details).      Patient presents to ED for evaluation of intermittent chest pain described as "gassy" sensation in her chest for the past week but got progressively worse this morning.  States that discomfort has essentially resolved since being here in the ED.  Unsure  if this is related to the meal that she ate yesterday.  No previous history of similar symptoms in the past.  Denies any vomiting, shortness of breath, fever, cough, hemoptysis, leg swelling, prior MI, DVT or PE or history of cancer.  She has a history of hypertension, diabetes.  She is afebrile with no use of antipyretics.  She is not tachycardic or tachypneic.  No signs of DVT on examination of the lower extremities. Low risk by HEART score. Well's criteria negative for PE. Delta troponin was negative. I have low suspicion for cardiac or pulmonary cause of her symptoms, which seem like they are most likely 2/2 dyspepsia. Will advise her to f/u with PCP for further evaluation, continue home medications as previously prescribed. Patient appears stable for discharge at this time. Strict return precautions given. Patient discussed with Dr. Patria Maneampos.  Portions of this note were generated with Scientist, clinical (histocompatibility and immunogenetics)Dragon dictation software. Dictation errors may occur despite best attempts at  proofreading.   Final Clinical Impressions(s) / ED Diagnoses   Final diagnoses:  Chest wall pain  Dyspepsia    ED Discharge Orders    None       Dietrich PatesKhatri, Imanol Bihl, PA-C 11/08/17 1503    Azalia Bilisampos, Kevin, MD 11/09/17 [phone]

## 2018-01-29 ENCOUNTER — Other Ambulatory Visit (INDEPENDENT_AMBULATORY_CARE_PROVIDER_SITE_OTHER): Payer: Self-pay | Admitting: Specialist

## 2018-01-29 NOTE — Telephone Encounter (Signed)
Diclofenac refill request 

## 2018-02-02 ENCOUNTER — Ambulatory Visit: Payer: Medicare Other | Admitting: Podiatry

## 2018-02-04 ENCOUNTER — Encounter: Payer: Self-pay | Admitting: Podiatry

## 2018-02-04 ENCOUNTER — Ambulatory Visit: Payer: Medicare Other | Admitting: Podiatry

## 2018-02-04 DIAGNOSIS — M79675 Pain in left toe(s): Secondary | ICD-10-CM

## 2018-02-04 DIAGNOSIS — M2041 Other hammer toe(s) (acquired), right foot: Secondary | ICD-10-CM | POA: Diagnosis not present

## 2018-02-04 DIAGNOSIS — M79674 Pain in right toe(s): Secondary | ICD-10-CM | POA: Diagnosis not present

## 2018-02-04 DIAGNOSIS — M2042 Other hammer toe(s) (acquired), left foot: Secondary | ICD-10-CM

## 2018-02-04 DIAGNOSIS — E119 Type 2 diabetes mellitus without complications: Secondary | ICD-10-CM

## 2018-02-04 DIAGNOSIS — B351 Tinea unguium: Secondary | ICD-10-CM

## 2018-02-04 NOTE — Progress Notes (Signed)
This patient presents to the office with chief complaint of long thick nails and diabetic feet.  This patient  says he is having no pain and discomfort in his feet.  This patient says he has long thick painful nails.  These nails are painful walking and wearing hshoes. She has no history of infection or drainage from both feet.  This patient presents the office today for treatment of the  long nails and a foot evaluation due to history of  diabetes.  General Appearance  Alert, conversant and in no acute stress.  Vascular  Dorsalis pedis and posterior tibial  pulses are palpable  bilaterally.  Capillary return is within normal limits  bilaterally. Temperature is within normal limits  bilaterally.  Neurologic  Senn-Weinstein monofilament wire test within normal limits  bilaterally. Muscle power within normal limits bilaterally.  Nails Thick disfigured discolored nails with subungual debris  from hallux to fifth toes bilaterally. No evidence of bacterial infection or drainage bilaterally.  Orthopedic  No limitations of motion of motion feet .  No crepitus or effusions noted.  No bony pathology or digital deformities noted.  Skin  normotropic skin with no porokeratosis noted bilaterally.  No signs of infections or ulcers noted.     Onychomycosis  Diabetes with no foot complications  IE  Debride nails x 10.  A diabetic foot exam was performed and there is no evidence of any vascular or neurologic pathology.   RTC 3 months.   Helane Gunther DPM

## 2018-04-04 ENCOUNTER — Other Ambulatory Visit (INDEPENDENT_AMBULATORY_CARE_PROVIDER_SITE_OTHER): Payer: Self-pay | Admitting: Specialist

## 2018-04-06 NOTE — Telephone Encounter (Signed)
Diclofenac Sod refill request,  Has not been seen since 04/27/2015.

## 2018-05-07 ENCOUNTER — Telehealth (INDEPENDENT_AMBULATORY_CARE_PROVIDER_SITE_OTHER): Payer: Self-pay | Admitting: *Deleted

## 2018-05-07 NOTE — Telephone Encounter (Signed)
Pt called and cancelled appt

## 2018-05-08 ENCOUNTER — Ambulatory Visit: Payer: Medicare Other | Admitting: Podiatry

## 2018-05-08 ENCOUNTER — Encounter: Payer: Self-pay | Admitting: Podiatry

## 2018-05-08 DIAGNOSIS — M79675 Pain in left toe(s): Secondary | ICD-10-CM | POA: Diagnosis not present

## 2018-05-08 DIAGNOSIS — M79674 Pain in right toe(s): Secondary | ICD-10-CM | POA: Diagnosis not present

## 2018-05-08 DIAGNOSIS — E119 Type 2 diabetes mellitus without complications: Secondary | ICD-10-CM | POA: Diagnosis not present

## 2018-05-08 DIAGNOSIS — B351 Tinea unguium: Secondary | ICD-10-CM

## 2018-05-08 NOTE — Progress Notes (Signed)
Complaint:  Visit Type: Patient returns to my office for continued preventative foot care services. Complaint: Patient states" my nails have grown long and thick and become painful to walk and wear shoes" Patient has been diagnosed with DM with no foot complications. The patient presents for preventative foot care services. No changes to ROS  Podiatric Exam: Vascular: dorsalis pedis and posterior tibial pulses are palpable bilateral. Capillary return is immediate. Temperature gradient is WNL. Skin turgor WNL  Sensorium: Normal Semmes Weinstein monofilament test. Normal tactile sensation bilaterally. Nail Exam: Pt has thick disfigured discolored nails with subungual debris noted bilateral entire nail hallux through fifth toenails Ulcer Exam: There is no evidence of ulcer or pre-ulcerative changes or infection. Orthopedic Exam: Muscle tone and strength are WNL. No limitations in general ROM. No crepitus or effusions noted. Foot type and digits show no abnormalities. Bony prominences are unremarkable. Skin: No Porokeratosis. No infection or ulcers  Diagnosis:  Onychomycosis, , Pain in right toe, pain in left toes  Treatment & Plan Procedures and Treatment: Consent by patient was obtained for treatment procedures.   Debridement of mycotic and hypertrophic toenails, 1 through 5 bilateral and clearing of subungual debris. No ulceration, no infection noted. Padding dispensed. Return Visit-Office Procedure: Patient instructed to return to the office for a follow up visit 3 months for continued evaluation and treatment.    Helane GuntherGregory Tabby Beaston DPM

## 2018-05-21 ENCOUNTER — Ambulatory Visit (INDEPENDENT_AMBULATORY_CARE_PROVIDER_SITE_OTHER): Payer: Self-pay | Admitting: Physical Medicine and Rehabilitation

## 2018-06-24 ENCOUNTER — Ambulatory Visit (INDEPENDENT_AMBULATORY_CARE_PROVIDER_SITE_OTHER): Payer: Self-pay

## 2018-06-24 ENCOUNTER — Ambulatory Visit (INDEPENDENT_AMBULATORY_CARE_PROVIDER_SITE_OTHER): Payer: Medicare Other | Admitting: Specialist

## 2018-06-24 ENCOUNTER — Encounter (INDEPENDENT_AMBULATORY_CARE_PROVIDER_SITE_OTHER): Payer: Self-pay | Admitting: Specialist

## 2018-06-24 VITALS — BP 159/77 | HR 72 | Ht 61.0 in | Wt 167.0 lb

## 2018-06-24 DIAGNOSIS — M4316 Spondylolisthesis, lumbar region: Secondary | ICD-10-CM

## 2018-06-24 DIAGNOSIS — M545 Low back pain: Secondary | ICD-10-CM | POA: Diagnosis not present

## 2018-06-24 DIAGNOSIS — M48062 Spinal stenosis, lumbar region with neurogenic claudication: Secondary | ICD-10-CM | POA: Diagnosis not present

## 2018-06-24 MED ORDER — TRAMADOL HCL 50 MG PO TABS: 50.0000 mg | ORAL_TABLET | Freq: Four times a day (QID) | ORAL | 0 refills | Status: DC | PRN

## 2018-06-24 NOTE — Progress Notes (Signed)
Office Visit Note   Patient: Karen [name]           Date of Birth: 09/03/1930           MRN: [id number] Visit Date: 06/24/2018              Requested by: Merri BrunettePharr, Walter, MD [address] [place], [place] [id number] PCP: Merri BrunettePharr, Walter, MD   Assessment & Plan: Visit Diagnoses:  1. Low back pain, unspecified back pain laterality, unspecified chronicity, with sciatica presence unspecified     Plan: Avoid bending, stooping and avoid lifting weights greater than 10 lbs. Avoid prolong standing and walking. Avoid frequent bending and stooping  No lifting greater than 10 lbs. May use ice or moist heat for pain. Weight loss is of benefit. Handicap license is approved. Flexion lumbar exercises, stationary bicycle, and pool walking are better tolerated A walker with a seat to use when legs are feeling tired  Follow-Up Instructions: No follow-ups on file.   Orders:  Orders Placed This Encounter  Procedures  . XR Lumbar Spine 2-3 Views   No orders of the defined types were placed in this encounter.     Procedures: No procedures performed   Clinical Data: No additional findings.   Subjective: Chief Complaint  Patient presents with  . Lower Back - Pain    84 year old female with past history of back pain has been seen in the ER about 2 year ago with right leg pain. She is seen today with left leg pain that starts in the back and radiates down the left leg, in the leg into the front of the left knee. She uses a brace occasionly to help to support the left knee. The leg does feel weak and she does walk. She has to use a cane to ambulate. She had xray today. No bowel or bladder difficulties.  Diabetes, not    Review of Systems  Constitutional: Negative.   HENT: Negative.   Eyes: Negative.   Respiratory: Negative.   Cardiovascular: Negative.   Gastrointestinal: Negative.   Endocrine: Negative.   Genitourinary: Negative.   Musculoskeletal: Negative.   Skin:  Negative.   Allergic/Immunologic: Negative.   Neurological: Negative.   Hematological: Negative.   Psychiatric/Behavioral: Negative.      Objective: Vital Signs: BP (!) 159/77 (BP Location: Left Arm, Patient Position: Sitting)   Pulse 72   Ht 5\' 1"  (1.549 m)   Wt 167 lb (75.8 kg)   BMI 31.55 kg/m   Physical Exam  Constitutional: She is oriented to person, place, and time. She appears well-developed and well-nourished.  HENT:  Head: Normocephalic and atraumatic.  Eyes: Pupils are equal, round, and reactive to light. EOM are normal.  Neck: Normal range of motion. Neck supple.  Pulmonary/Chest: Effort normal and breath sounds normal.  Abdominal: Soft. Bowel sounds are normal.  Neurological: She is alert and oriented to person, place, and time.  Skin: Skin is warm and dry.  Psychiatric: She has a normal mood and affect. Her behavior is normal. Judgment and thought content normal.    Back Exam   Range of Motion  Extension: abnormal  Flexion: normal  Lateral bend right: normal  Lateral bend left: normal  Rotation right: normal  Rotation left: normal   Muscle Strength  Right Quadriceps:  5/5  Left Quadriceps:  5/5  Right Hamstrings:  5/5  Left Hamstrings:  5/5   Tests  Straight leg raise right: negative Straight leg raise  left: negative  Reflexes  Patellar: Hyporeflexic Achilles: Hyporeflexic Babinski's sign: normal   Other  Toe walk: normal Heel walk: normal Sensation: normal Gait: normal  Erythema: no back redness Scars: absent      Specialty Comments:  No specialty comments available.  Imaging: No results found.   PMFS History: Patient Active Problem List   Diagnosis Date Noted  . Intracranial bleed (HCC) 06/29/2016  . Fall 06/29/2016  . Syncope and collapse 06/29/2016  . Diabetes (HCC) 06/29/2016  . HTN (hypertension) 06/29/2016   Past Medical History:  Diagnosis Date  . Back pain   . Diabetes mellitus without complication (HCC)       History reviewed. No pertinent family history.  Past Surgical History:  Procedure Laterality Date  . ABDOMINAL HYSTERECTOMY    . COLON SURGERY  2005   blockage   Social History   Occupational History  . Not on file  Tobacco Use  . Smoking status: Never Smoker  . Smokeless tobacco: Never Used  Substance and Sexual Activity  . Alcohol use: No  . Drug use: No  . Sexual activity: Never

## 2018-06-24 NOTE — Patient Instructions (Addendum)
Avoid bending, stooping and avoid lifting weights greater than 10 lbs. Avoid prolong standing and walking. Avoid frequent bending and stooping  No lifting greater than 10 lbs. May use ice or moist heat for pain. Weight loss is of benefit. Handicap license is approved. Flexion lumbar exercises, stationary bicycle, and pool walking are better tolerated A walker with a seat to use when legs are feeling tired.

## 2018-06-29 ENCOUNTER — Other Ambulatory Visit: Payer: Self-pay | Admitting: Internal Medicine

## 2018-06-29 DIAGNOSIS — Z1231 Encounter for screening mammogram for malignant neoplasm of breast: Secondary | ICD-10-CM

## 2018-07-30 ENCOUNTER — Ambulatory Visit
Admission: RE | Admit: 2018-07-30 | Discharge: 2018-07-30 | Disposition: A | Discharge status: Home / Self Care | Payer: Medicare Other | Source: Ambulatory Visit | Attending: Internal Medicine | Admitting: Internal Medicine

## 2018-07-30 DIAGNOSIS — Z1231 Encounter for screening mammogram for malignant neoplasm of breast: Secondary | ICD-10-CM

## 2018-08-07 ENCOUNTER — Ambulatory Visit: Payer: Medicare Other | Admitting: Podiatry

## 2018-08-07 ENCOUNTER — Encounter: Payer: Self-pay | Admitting: Podiatry

## 2018-08-07 DIAGNOSIS — E119 Type 2 diabetes mellitus without complications: Secondary | ICD-10-CM

## 2018-08-07 DIAGNOSIS — B351 Tinea unguium: Secondary | ICD-10-CM | POA: Diagnosis not present

## 2018-08-07 DIAGNOSIS — M79674 Pain in right toe(s): Secondary | ICD-10-CM | POA: Diagnosis not present

## 2018-08-07 DIAGNOSIS — M79675 Pain in left toe(s): Secondary | ICD-10-CM | POA: Diagnosis not present

## 2018-08-07 NOTE — Progress Notes (Signed)
Complaint:  Visit Type: Patient returns to my office for continued preventative foot care services. Complaint: Patient states" my nails have grown long and thick and become painful to walk and wear shoes" Patient has been diagnosed with DM with no foot complications. The patient presents for preventative foot care services. No changes to ROS  Podiatric Exam: Vascular: dorsalis pedis and posterior tibial pulses are palpable bilateral. Capillary return is immediate. Temperature gradient is WNL. Skin turgor WNL  Sensorium: Normal Semmes Weinstein monofilament test. Normal tactile sensation bilaterally. Nail Exam: Pt has thick disfigured discolored nails with subungual debris noted bilateral entire nail hallux through fifth toenails Ulcer Exam: There is no evidence of ulcer or pre-ulcerative changes or infection. Orthopedic Exam: Muscle tone and strength are WNL. No limitations in general ROM. No crepitus or effusions noted. Foot type and digits show no abnormalities. Bony prominences are unremarkable. Skin: No Porokeratosis. No infection or ulcers  Diagnosis:  Onychomycosis, , Pain in right toe, pain in left toes  Treatment & Plan Procedures and Treatment: Consent by patient was obtained for treatment procedures.   Debridement of mycotic and hypertrophic toenails, 1 through 5 bilateral and clearing of subungual debris. No ulceration, no infection noted.  Return Visit-Office Procedure: Patient instructed to return to the office for a follow up visit 3 months for continued evaluation and treatment.    Carollynn Pennywell DPM 

## 2018-09-03 ENCOUNTER — Ambulatory Visit (INDEPENDENT_AMBULATORY_CARE_PROVIDER_SITE_OTHER): Payer: Medicare Other | Admitting: Specialist

## 2018-09-03 ENCOUNTER — Encounter (INDEPENDENT_AMBULATORY_CARE_PROVIDER_SITE_OTHER): Payer: Self-pay | Admitting: Specialist

## 2018-09-03 VITALS — BP 126/69 | HR 83 | Ht 61.0 in | Wt 165.0 lb

## 2018-09-03 DIAGNOSIS — M545 Low back pain, unspecified: Secondary | ICD-10-CM

## 2018-09-03 DIAGNOSIS — G8929 Other chronic pain: Secondary | ICD-10-CM

## 2018-09-03 MED ORDER — TRAMADOL HCL 50 MG PO TABS: 50.0000 mg | ORAL_TABLET | Freq: Four times a day (QID) | ORAL | 0 refills | Status: DC | PRN

## 2018-09-03 NOTE — Patient Instructions (Signed)
Plan: Avoid frequent bending and stooping  No lifting greater than 10 lbs. May use ice or moist heat for pain. Weight loss is of benefit. Best medication for lumbar disc disease is tramadol since the arthritis medications such as motrin and alleve place you at risk of side effects. Exercise is important to improve your indurance and does allow people to function better inspite of back pain. Fall Prevention and Home Safety Falls cause injuries and can affect all age groups. It is possible to use preventive measures to significantly decrease the likelihood of falls. There are many simple measures which can make your home safer and prevent falls. OUTDOORS  Repair cracks and edges of walkways and driveways.  Remove high doorway thresholds.  Trim shrubbery on the main path into your home.  Have good outside lighting.  Clear walkways of tools, rocks, debris, and clutter.  Check that handrails are not broken and are securely fastened. Both sides of steps should have handrails.  Have leaves, snow, and ice cleared regularly.  Use sand or salt on walkways during winter months.  In the garage, clean up grease or oil spills. BATHROOM  Install night lights.  Install grab bars by the toilet and in the tub and shower.  Use non-skid mats or decals in the tub or shower.  Place a plastic non-slip stool in the shower to sit on, if needed.  Keep floors dry and clean up all water on the floor immediately.  Remove soap buildup in the tub or shower on a regular basis.  Secure bath mats with non-slip, double-sided rug tape.  Remove throw rugs and tripping hazards from the floors. BEDROOMS  Install night lights.  Make sure a bedside light is easy to reach.  Do not use oversized bedding.  Keep a telephone by your bedside.  Have a firm chair with side arms to use for getting dressed.  Remove throw rugs and tripping hazards from the floor. KITCHEN  Keep handles on pots and pans turned  toward the center of the stove. Use back burners when possible.  Clean up spills quickly and allow time for drying.  Avoid walking on wet floors.  Avoid hot utensils and knives.  Position shelves so they are not too high or low.  Place commonly used objects within easy reach.  If necessary, use a sturdy step stool with a grab bar when reaching.  Keep electrical cables out of the way.  Do not use floor polish or wax that makes floors slippery. If you must use wax, use non-skid floor wax.  Remove throw rugs and tripping hazards from the floor. STAIRWAYS  Never leave objects on stairs.  Place handrails on both sides of stairways and use them. Fix any loose handrails. Make sure handrails on both sides of the stairways are as long as the stairs.  Check carpeting to make sure it is firmly attached along stairs. Make repairs to worn or loose carpet promptly.  Avoid placing throw rugs at the top or bottom of stairways, or properly secure the rug with carpet tape to prevent slippage. Get rid of throw rugs, if possible.  Have an electrician put in a light switch at the top and bottom of the stairs. OTHER FALL PREVENTION TIPS  Wear low-heel or rubber-soled shoes that are supportive and fit well. Wear closed toe shoes.  When using a stepladder, make sure it is fully opened and both spreaders are firmly locked. Do not climb a closed stepladder.  Add color or  contrast paint or tape to grab bars and handrails in your home. Place contrasting color strips on first and last steps.  Learn and use mobility aids as needed. Install an electrical emergency response system.  Turn on lights to avoid dark areas. Replace light bulbs that burn out immediately. Get light switches that glow.  Arrange furniture to create clear pathways. Keep furniture in the same place.  Firmly attach carpet with non-skid or double-sided tape.  Eliminate uneven floor surfaces.  Select a carpet pattern that does not  visually hide the edge of steps.  Be aware of all pets. OTHER HOME SAFETY TIPS  Set the water temperature for 120 F (48.8 C).  Keep emergency numbers on or near the telephone.  Keep smoke detectors on every level of the home and near sleeping areas. Document Released: 09/06/2002 Document Revised: 03/17/2012 Document Reviewed: 12/06/2011 [facility] Patient Information 2014 [employer] [place].

## 2018-09-03 NOTE — Progress Notes (Signed)
Office Visit Note   Patient: Karen [name]           Date of Birth: 1930/06/19           MRN: [id number] Visit Date: 09/03/2018              Requested by: Merri Brunette, MD [address] [place], [place] [address] PCP: Merri Brunette, MD   Assessment & Plan: Visit Diagnoses:  1. Chronic bilateral low back pain without sciatica     Plan: Avoid frequent bending and stooping  No lifting greater than 10 lbs. May use ice or moist heat for pain. Weight loss is of benefit. Best medication for lumbar disc disease is tramadol since the arthritis medications such as motrin and alleve place you at risk of side effects. Exercise is important to improve your indurance and does allow people to function better inspite of back pain. Fall Prevention and Home Safety Falls cause injuries and can affect all age groups. It is possible to use preventive measures to significantly decrease the likelihood of falls. There are many simple measures which can make your home safer and prevent falls. OUTDOORS  Repair cracks and edges of walkways and driveways.  Remove high doorway thresholds.  Trim shrubbery on the main path into your home.  Have good outside lighting.  Clear walkways of tools, rocks, debris, and clutter.  Check that handrails are not broken and are securely fastened. Both sides of steps should have handrails.  Have leaves, snow, and ice cleared regularly.  Use sand or salt on walkways during winter months.  In the garage, clean up grease or oil spills. BATHROOM  Install night lights.  Install grab bars by the toilet and in the tub and shower.  Use non-skid mats or decals in the tub or shower.  Place a plastic non-slip stool in the shower to sit on, if needed.  Keep floors dry and clean up all water on the floor immediately.  Remove soap buildup in the tub or shower on a regular basis.  Secure bath mats with non-slip, double-sided rug tape.  Remove throw  rugs and tripping hazards from the floors. BEDROOMS  Install night lights.  Make sure a bedside light is easy to reach.  Do not use oversized bedding.  Keep a telephone by your bedside.  Have a firm chair with side arms to use for getting dressed.  Remove throw rugs and tripping hazards from the floor. KITCHEN  Keep handles on pots and pans turned toward the center of the stove. Use back burners when possible.  Clean up spills quickly and allow time for drying.  Avoid walking on wet floors.  Avoid hot utensils and knives.  Position shelves so they are not too high or low.  Place commonly used objects within easy reach.  If necessary, use a sturdy step stool with a grab bar when reaching.  Keep electrical cables out of the way.  Do not use floor polish or wax that makes floors slippery. If you must use wax, use non-skid floor wax.  Remove throw rugs and tripping hazards from the floor. STAIRWAYS  Never leave objects on stairs.  Place handrails on both sides of stairways and use them. Fix any loose handrails. Make sure handrails on both sides of the stairways are as long as the stairs.  Check carpeting to make sure it is firmly attached along stairs. Make repairs to worn or loose carpet promptly.  Avoid placing throw rugs  at the top or bottom of stairways, or properly secure the rug with carpet tape to prevent slippage. Get rid of throw rugs, if possible.  Have an electrician put in a light switch at the top and bottom of the stairs. OTHER FALL PREVENTION TIPS  Wear low-heel or rubber-soled shoes that are supportive and fit well. Wear closed toe shoes.  When using a stepladder, make sure it is fully opened and both spreaders are firmly locked. Do not climb a closed stepladder.  Add color or contrast paint or tape to grab bars and handrails in your home. Place contrasting color strips on first and last steps.  Learn and use mobility aids as needed. Install an  electrical emergency response system.  Turn on lights to avoid dark areas. Replace light bulbs that burn out immediately. Get light switches that glow.  Arrange furniture to create clear pathways. Keep furniture in the same place.  Firmly attach carpet with non-skid or double-sided tape.  Eliminate uneven floor surfaces.  Select a carpet pattern that does not visually hide the edge of steps.  Be aware of all pets. OTHER HOME SAFETY TIPS  Set the water temperature for 120 F (48.8 C).  Keep emergency numbers on or near the telephone.  Keep smoke detectors on every level of the home and near sleeping areas. Document Released: 09/06/2002 Document Revised: 03/17/2012 Document Reviewed: 12/06/2011 [facility] Patient Information 2014 [place].    Follow-Up Instructions: No follow-ups on file.   Orders:  No orders of the defined types were placed in this encounter.  No orders of the defined types were placed in this encounter.     Procedures: No procedures performed   Clinical Data: No additional findings.   Subjective: Chief Complaint  Patient presents with  . Lower Back - Follow-up    84 year old female with history of low back pain and has fallen in the recent past. She was getting out of a chair she slipped and fell. No injury and she was able to get upright.No bowel or bladder difficulty. She works on [address] part time work.   Review of Systems  Constitutional: Negative.   HENT: Negative.   Eyes: Negative.   Respiratory: Negative.   Cardiovascular: Negative.   Gastrointestinal: Negative.   Endocrine: Negative.   Genitourinary: Negative.   Musculoskeletal: Negative.   Skin: Negative.   Allergic/Immunologic: Negative.   Neurological: Negative.   Hematological: Negative.   Psychiatric/Behavioral: Negative.      Objective: Vital Signs: BP 126/69 (BP Location: Left Arm, Patient Position: Sitting)   Pulse 83   Ht 5\' 1"  (1.549 m)   Wt 165 lb  (74.8 kg)   BMI 31.18 kg/m   Physical Exam  Constitutional: She is oriented to person, place, and time. She appears well-developed and well-nourished.  HENT:  Head: Normocephalic and atraumatic.  Eyes: Pupils are equal, round, and reactive to light. EOM are normal.  Neck: Normal range of motion. Neck supple.  Pulmonary/Chest: Effort normal and breath sounds normal.  Abdominal: Soft. Bowel sounds are normal.  Neurological: She is alert and oriented to person, place, and time.  Skin: Skin is warm and dry.  Psychiatric: She has a normal mood and affect. Her behavior is normal. Judgment and thought content normal.    Back Exam   Tenderness  The patient is experiencing tenderness in the lumbar.  Range of Motion  Extension: normal  Flexion: abnormal  Lateral bend right: normal  Lateral bend left: normal  Rotation right: normal  Rotation left: normal   Muscle Strength  Right Quadriceps:  5/5  Left Quadriceps:  5/5  Right Hamstrings:  5/5  Left Hamstrings:  5/5   Tests  Straight leg raise right: negative Straight leg raise left: negative  Reflexes  Achilles: 0/4  Other  Toe walk: normal Heel walk: normal Sensation: normal Gait: abnormal  Erythema: no back redness Scars: absent      Specialty Comments:  No specialty comments available.  Imaging: No results found.   PMFS History: Patient Active Problem List   Diagnosis Date Noted  . Intracranial bleed (HCC) 06/29/2016  . Fall 06/29/2016  . Syncope and collapse 06/29/2016  . Diabetes (HCC) 06/29/2016  . HTN (hypertension) 06/29/2016   Past Medical History:  Diagnosis Date  . Back pain   . Diabetes mellitus without complication (HCC)     History reviewed. No pertinent family history.  Past Surgical History:  Procedure Laterality Date  . ABDOMINAL HYSTERECTOMY    . COLON SURGERY  2005   blockage   Social History   Occupational History  . Not on file  Tobacco Use  . Smoking status: Never  Smoker  . Smokeless tobacco: Never Used  Substance and Sexual Activity  . Alcohol use: No  . Drug use: No  . Sexual activity: Never

## 2018-11-06 ENCOUNTER — Ambulatory Visit: Payer: Medicare Other | Admitting: Podiatry

## 2018-11-18 ENCOUNTER — Encounter: Payer: Self-pay | Admitting: Podiatry

## 2018-11-18 ENCOUNTER — Ambulatory Visit: Payer: Medicare Other | Admitting: Podiatry

## 2018-11-18 DIAGNOSIS — M79674 Pain in right toe(s): Secondary | ICD-10-CM | POA: Diagnosis not present

## 2018-11-18 DIAGNOSIS — B351 Tinea unguium: Secondary | ICD-10-CM

## 2018-11-18 DIAGNOSIS — M2041 Other hammer toe(s) (acquired), right foot: Secondary | ICD-10-CM

## 2018-11-18 DIAGNOSIS — M79675 Pain in left toe(s): Secondary | ICD-10-CM | POA: Diagnosis not present

## 2018-11-18 DIAGNOSIS — E119 Type 2 diabetes mellitus without complications: Secondary | ICD-10-CM

## 2018-11-18 DIAGNOSIS — M2042 Other hammer toe(s) (acquired), left foot: Secondary | ICD-10-CM

## 2018-11-18 NOTE — Progress Notes (Signed)
Complaint:  Visit Type: Patient returns to my office for continued preventative foot care services. Complaint: Patient states" my nails have grown long and thick and become painful to walk and wear shoes" Patient has been diagnosed with DM with no foot complications. The patient presents for preventative foot care services. No changes to ROS  Podiatric Exam: Vascular: dorsalis pedis and posterior tibial pulses are palpable bilateral. Capillary return is immediate. Temperature gradient is WNL. Skin turgor WNL  Sensorium: Normal Semmes Weinstein monofilament test. Normal tactile sensation bilaterally. Nail Exam: Pt has thick disfigured discolored nails with subungual debris noted bilateral entire nail hallux through fifth toenails Ulcer Exam: There is no evidence of ulcer or pre-ulcerative changes or infection. Orthopedic Exam: Muscle tone and strength are WNL. No limitations in general ROM. No crepitus or effusions noted. Foot type and digits show no abnormalities. Bony prominences are unremarkable. Skin: No Porokeratosis. No infection or ulcers  Diagnosis:  Onychomycosis, , Pain in right toe, pain in left toes  Treatment & Plan Procedures and Treatment: Consent by patient was obtained for treatment procedures.   Debridement of mycotic and hypertrophic toenails, 1 through 5 bilateral and clearing of subungual debris. No ulceration, no infection noted.  Return Visit-Office Procedure: Patient instructed to return to the office for a follow up visit 3 months for continued evaluation and treatment.    Oluwatosin Higginson DPM 

## 2018-12-20 ENCOUNTER — Other Ambulatory Visit (INDEPENDENT_AMBULATORY_CARE_PROVIDER_SITE_OTHER): Payer: Self-pay | Admitting: Specialist

## 2018-12-21 NOTE — Telephone Encounter (Signed)
Tramadol refill request 

## 2019-02-17 ENCOUNTER — Encounter: Payer: Self-pay | Admitting: Podiatry

## 2019-02-17 ENCOUNTER — Ambulatory Visit: Payer: Medicare Other | Admitting: Podiatry

## 2019-02-17 ENCOUNTER — Other Ambulatory Visit: Payer: Self-pay

## 2019-02-17 VITALS — Temp 97.5°F

## 2019-02-17 DIAGNOSIS — B351 Tinea unguium: Secondary | ICD-10-CM

## 2019-02-17 DIAGNOSIS — M79674 Pain in right toe(s): Secondary | ICD-10-CM

## 2019-02-17 DIAGNOSIS — M79675 Pain in left toe(s): Secondary | ICD-10-CM | POA: Diagnosis not present

## 2019-02-17 DIAGNOSIS — E119 Type 2 diabetes mellitus without complications: Secondary | ICD-10-CM

## 2019-02-17 NOTE — Progress Notes (Signed)
Complaint:  Visit Type: Patient returns to my office for continued preventative foot care services. Complaint: Patient states" my nails have grown long and thick and become painful to walk and wear shoes" Patient has been diagnosed with DM with no foot complications. The patient presents for preventative foot care services. No changes to ROS  Podiatric Exam: Vascular: dorsalis pedis and posterior tibial pulses are palpable bilateral. Capillary return is immediate. Temperature gradient is WNL. Skin turgor WNL  Sensorium: Normal Semmes Weinstein monofilament test. Normal tactile sensation bilaterally. Nail Exam: Pt has thick disfigured discolored nails with subungual debris noted bilateral entire nail hallux through fifth toenails Ulcer Exam: There is no evidence of ulcer or pre-ulcerative changes or infection. Orthopedic Exam: Muscle tone and strength are WNL. No limitations in general ROM. No crepitus or effusions noted. Foot type and digits show no abnormalities. Bony prominences are unremarkable. Skin: No Porokeratosis. No infection or ulcers  Diagnosis:  Onychomycosis, , Pain in right toe, pain in left toes  Treatment & Plan Procedures and Treatment: Consent by patient was obtained for treatment procedures.   Debridement of mycotic and hypertrophic toenails, 1 through 5 bilateral and clearing of subungual debris. No ulceration, no infection noted.  Return Visit-Office Procedure: Patient instructed to return to the office for a follow up visit 3 months for continued evaluation and treatment.    Dianara Smullen DPM 

## 2019-04-20 ENCOUNTER — Other Ambulatory Visit (INDEPENDENT_AMBULATORY_CARE_PROVIDER_SITE_OTHER): Payer: Self-pay | Admitting: Specialist

## 2019-04-20 NOTE — Telephone Encounter (Signed)
Tramadol refill request 

## 2019-05-26 ENCOUNTER — Ambulatory Visit: Payer: Medicare Other | Admitting: Podiatry

## 2019-05-26 ENCOUNTER — Other Ambulatory Visit: Payer: Self-pay

## 2019-05-26 ENCOUNTER — Encounter: Payer: Self-pay | Admitting: Podiatry

## 2019-05-26 VITALS — Temp 98.9°F

## 2019-05-26 DIAGNOSIS — E119 Type 2 diabetes mellitus without complications: Secondary | ICD-10-CM | POA: Diagnosis not present

## 2019-05-26 DIAGNOSIS — B351 Tinea unguium: Secondary | ICD-10-CM

## 2019-05-26 DIAGNOSIS — M79674 Pain in right toe(s): Secondary | ICD-10-CM | POA: Diagnosis not present

## 2019-05-26 DIAGNOSIS — M79675 Pain in left toe(s): Secondary | ICD-10-CM

## 2019-05-26 NOTE — Progress Notes (Signed)
Complaint:  Visit Type: Patient returns to my office for continued preventative foot care services. Complaint: Patient states" my nails have grown long and thick and become painful to walk and wear shoes" Patient has been diagnosed with DM with no foot complications. The patient presents for preventative foot care services. No changes to ROS  Podiatric Exam: Vascular: dorsalis pedis and posterior tibial pulses are palpable bilateral. Capillary return is immediate. Temperature gradient is WNL. Skin turgor WNL  Sensorium: Normal Semmes Weinstein monofilament test. Normal tactile sensation bilaterally. Nail Exam: Pt has thick disfigured discolored nails with subungual debris noted bilateral entire nail hallux through fifth toenails Ulcer Exam: There is no evidence of ulcer or pre-ulcerative changes or infection. Orthopedic Exam: Muscle tone and strength are WNL. No limitations in general ROM. No crepitus or effusions noted. Foot type and digits show no abnormalities. Bony prominences are unremarkable. Skin: No Porokeratosis. No infection or ulcers  Diagnosis:  Onychomycosis, , Pain in right toe, pain in left toes  Treatment & Plan Procedures and Treatment: Consent by patient was obtained for treatment procedures.   Debridement of mycotic and hypertrophic toenails, 1 through 5 bilateral and clearing of subungual debris. No ulceration, no infection noted.  Return Visit-Office Procedure: Patient instructed to return to the office for a follow up visit 3 months for continued evaluation and treatment.    Neomia Herbel DPM 

## 2019-06-22 ENCOUNTER — Other Ambulatory Visit: Payer: Self-pay | Admitting: Internal Medicine

## 2019-06-22 DIAGNOSIS — Z1231 Encounter for screening mammogram for malignant neoplasm of breast: Secondary | ICD-10-CM

## 2019-08-09 ENCOUNTER — Ambulatory Visit
Admission: RE | Admit: 2019-08-09 | Discharge: 2019-08-09 | Disposition: A | Discharge status: Home / Self Care | Payer: Medicare Other | Source: Ambulatory Visit | Attending: Internal Medicine | Admitting: Internal Medicine

## 2019-08-09 ENCOUNTER — Other Ambulatory Visit: Payer: Self-pay

## 2019-08-09 DIAGNOSIS — Z1231 Encounter for screening mammogram for malignant neoplasm of breast: Secondary | ICD-10-CM

## 2019-08-25 ENCOUNTER — Ambulatory Visit: Payer: Medicare Other | Admitting: Podiatry

## 2019-09-03 ENCOUNTER — Other Ambulatory Visit: Payer: Self-pay

## 2019-09-03 ENCOUNTER — Ambulatory Visit: Payer: Medicare Other | Admitting: Podiatry

## 2019-09-03 ENCOUNTER — Encounter: Payer: Self-pay | Admitting: Podiatry

## 2019-09-03 DIAGNOSIS — M2041 Other hammer toe(s) (acquired), right foot: Secondary | ICD-10-CM

## 2019-09-03 DIAGNOSIS — M79675 Pain in left toe(s): Secondary | ICD-10-CM

## 2019-09-03 DIAGNOSIS — M79674 Pain in right toe(s): Secondary | ICD-10-CM

## 2019-09-03 DIAGNOSIS — E119 Type 2 diabetes mellitus without complications: Secondary | ICD-10-CM | POA: Diagnosis not present

## 2019-09-03 DIAGNOSIS — M2042 Other hammer toe(s) (acquired), left foot: Secondary | ICD-10-CM

## 2019-09-03 DIAGNOSIS — B351 Tinea unguium: Secondary | ICD-10-CM

## 2019-09-03 NOTE — Progress Notes (Signed)
Complaint:  Visit Type: Patient returns to my office for continued preventative foot care services. Complaint: Patient states" my nails have grown long and thick and become painful to walk and wear shoes" Patient has been diagnosed with DM with no foot complications. The patient presents for preventative foot care services. No changes to ROS.  Patient told me she was disgusted having to wait to be seen.  Podiatric Exam: Vascular: dorsalis pedis and posterior tibial pulses are palpable bilateral. Capillary return is immediate. Temperature gradient is WNL. Skin turgor WNL  Sensorium: Normal Semmes Weinstein monofilament test. Normal tactile sensation bilaterally. Nail Exam: Pt has thick disfigured discolored nails with subungual debris noted bilateral entire nail hallux through fifth toenails Ulcer Exam: There is no evidence of ulcer or pre-ulcerative changes or infection. Orthopedic Exam: Muscle tone and strength are WNL. No limitations in general ROM. No crepitus or effusions noted. Hammer toes second  B/L. Bony prominences are unremarkable. Skin: No Porokeratosis. No infection or ulcers  Diagnosis:  Onychomycosis, , Pain in right toe, pain in left toes  Treatment & Plan Procedures and Treatment: Consent by patient was obtained for treatment procedures.   Debridement of mycotic and hypertrophic toenails, 1 through 5 bilateral and clearing of subungual debris. No ulceration, no infection noted. Patient does not qualify for diabetic shoes but she was told to obtain spenco insoles to be worn in her shoes. Return Visit-Office Procedure: Patient instructed to return to the office for a follow up visit 3 months for continued evaluation and treatment.    Gardiner Barefoot DPM

## 2019-10-12 ENCOUNTER — Telehealth: Payer: Self-pay | Admitting: Specialist

## 2019-10-12 NOTE — Telephone Encounter (Signed)
Karen [name] from [facility] called. They need a RX for Tramadol for patient faxed or called in. Fax number is [phone]

## 2019-10-13 ENCOUNTER — Telehealth: Payer: Self-pay

## 2019-10-13 NOTE — Telephone Encounter (Signed)
Feliz Beam with [facility] pharmacy called stating that they received a Rx for Tramadol without directions.  Stated that a new Rx for Tramadol with the directions needs to be resent or you can call and give a verbal.  Cb# [phone].  Please advise.  Thank you.

## 2019-10-13 NOTE — Telephone Encounter (Signed)
I refaxed order with directions attached to it. 

## 2019-10-13 NOTE — Telephone Encounter (Signed)
I refaxed order with directions attached to it.

## 2019-11-26 DIAGNOSIS — E782 Mixed hyperlipidemia: Secondary | ICD-10-CM | POA: Diagnosis not present

## 2019-11-26 DIAGNOSIS — Z794 Long term (current) use of insulin: Secondary | ICD-10-CM | POA: Diagnosis not present

## 2019-11-26 DIAGNOSIS — E119 Type 2 diabetes mellitus without complications: Secondary | ICD-10-CM | POA: Diagnosis not present

## 2019-11-29 DIAGNOSIS — E782 Mixed hyperlipidemia: Secondary | ICD-10-CM | POA: Diagnosis not present

## 2019-11-29 DIAGNOSIS — I10 Essential (primary) hypertension: Secondary | ICD-10-CM | POA: Diagnosis not present

## 2019-11-29 DIAGNOSIS — Z794 Long term (current) use of insulin: Secondary | ICD-10-CM | POA: Diagnosis not present

## 2019-11-29 DIAGNOSIS — E113293 Type 2 diabetes mellitus with mild nonproliferative diabetic retinopathy without macular edema, bilateral: Secondary | ICD-10-CM | POA: Diagnosis not present

## 2019-11-29 DIAGNOSIS — M8580 Other specified disorders of bone density and structure, unspecified site: Secondary | ICD-10-CM | POA: Diagnosis not present

## 2019-12-03 ENCOUNTER — Ambulatory Visit: Payer: Medicare Other | Admitting: Podiatry

## 2020-01-11 DIAGNOSIS — Z8249 Family history of ischemic heart disease and other diseases of the circulatory system: Secondary | ICD-10-CM | POA: Diagnosis not present

## 2020-01-11 DIAGNOSIS — K08409 Partial loss of teeth, unspecified cause, unspecified class: Secondary | ICD-10-CM | POA: Diagnosis not present

## 2020-01-11 DIAGNOSIS — I10 Essential (primary) hypertension: Secondary | ICD-10-CM | POA: Diagnosis not present

## 2020-01-11 DIAGNOSIS — E669 Obesity, unspecified: Secondary | ICD-10-CM | POA: Diagnosis not present

## 2020-01-11 DIAGNOSIS — K219 Gastro-esophageal reflux disease without esophagitis: Secondary | ICD-10-CM | POA: Diagnosis not present

## 2020-01-11 DIAGNOSIS — E119 Type 2 diabetes mellitus without complications: Secondary | ICD-10-CM | POA: Diagnosis not present

## 2020-01-11 DIAGNOSIS — E785 Hyperlipidemia, unspecified: Secondary | ICD-10-CM | POA: Diagnosis not present

## 2020-01-11 DIAGNOSIS — Z794 Long term (current) use of insulin: Secondary | ICD-10-CM | POA: Diagnosis not present

## 2020-01-11 DIAGNOSIS — R32 Unspecified urinary incontinence: Secondary | ICD-10-CM | POA: Diagnosis not present

## 2020-01-28 ENCOUNTER — Ambulatory Visit: Payer: Medicare PPO | Admitting: Podiatry

## 2020-01-28 ENCOUNTER — Other Ambulatory Visit: Payer: Self-pay

## 2020-01-28 ENCOUNTER — Encounter: Payer: Self-pay | Admitting: Podiatry

## 2020-01-28 VITALS — Temp 96.5°F

## 2020-01-28 DIAGNOSIS — E119 Type 2 diabetes mellitus without complications: Secondary | ICD-10-CM | POA: Diagnosis not present

## 2020-01-28 DIAGNOSIS — M2041 Other hammer toe(s) (acquired), right foot: Secondary | ICD-10-CM

## 2020-01-28 DIAGNOSIS — M79674 Pain in right toe(s): Secondary | ICD-10-CM

## 2020-01-28 DIAGNOSIS — M2042 Other hammer toe(s) (acquired), left foot: Secondary | ICD-10-CM

## 2020-01-28 DIAGNOSIS — B351 Tinea unguium: Secondary | ICD-10-CM | POA: Diagnosis not present

## 2020-01-28 DIAGNOSIS — M79675 Pain in left toe(s): Secondary | ICD-10-CM

## 2020-01-28 NOTE — Progress Notes (Signed)
This patient returns to my office for at risk foot care.  This patient requires this care by a professional since this patient will be at risk due to having type 2 diabetes.  This patient is unable to cut nails herself since the patient cannot reach her nails.These nails are painful walking and wearing shoes.  This patient presents for at risk foot care today.  General Appearance  Alert, conversant and in no acute stress.  Vascular  Dorsalis pedis and posterior tibial  pulses are palpable  bilaterally.  Capillary return is within normal limits  bilaterally. Temperature is within normal limits  bilaterally.  Neurologic  Senn-Weinstein monofilament wire test within normal limits  bilaterally. Muscle power within normal limits bilaterally.  Nails Thick disfigured discolored nails with subungual debris  from hallux to fifth toes bilaterally. No evidence of bacterial infection or drainage bilaterally.  Orthopedic  No limitations of motion  feet .  No crepitus or effusions noted.  No bony pathology .  Hammer toes second  B/L.  Skin  normotropic skin with no porokeratosis noted bilaterally.  No signs of infections or ulcers noted.     Onychomycosis  Pain in right toes  Pain in left toes  Consent was obtained for treatment procedures.   Mechanical debridement of nails 1-5  bilaterally performed with a nail nipper.  Filed with dremel without incident.    Return office visit  3 months                   Told patient to return for periodic foot care and evaluation due to potential at risk complications.   Candace Begue DPM  

## 2020-02-16 DIAGNOSIS — E782 Mixed hyperlipidemia: Secondary | ICD-10-CM | POA: Diagnosis not present

## 2020-02-16 DIAGNOSIS — Z794 Long term (current) use of insulin: Secondary | ICD-10-CM | POA: Diagnosis not present

## 2020-02-16 DIAGNOSIS — E119 Type 2 diabetes mellitus without complications: Secondary | ICD-10-CM | POA: Diagnosis not present

## 2020-03-01 DIAGNOSIS — E669 Obesity, unspecified: Secondary | ICD-10-CM | POA: Diagnosis not present

## 2020-03-01 DIAGNOSIS — M8580 Other specified disorders of bone density and structure, unspecified site: Secondary | ICD-10-CM | POA: Diagnosis not present

## 2020-03-01 DIAGNOSIS — Z794 Long term (current) use of insulin: Secondary | ICD-10-CM | POA: Diagnosis not present

## 2020-03-01 DIAGNOSIS — E1142 Type 2 diabetes mellitus with diabetic polyneuropathy: Secondary | ICD-10-CM | POA: Diagnosis not present

## 2020-03-01 DIAGNOSIS — E782 Mixed hyperlipidemia: Secondary | ICD-10-CM | POA: Diagnosis not present

## 2020-03-01 DIAGNOSIS — E113293 Type 2 diabetes mellitus with mild nonproliferative diabetic retinopathy without macular edema, bilateral: Secondary | ICD-10-CM | POA: Diagnosis not present

## 2020-03-01 DIAGNOSIS — I10 Essential (primary) hypertension: Secondary | ICD-10-CM | POA: Diagnosis not present

## 2020-03-02 DIAGNOSIS — E119 Type 2 diabetes mellitus without complications: Secondary | ICD-10-CM | POA: Diagnosis not present

## 2020-03-02 DIAGNOSIS — H40023 Open angle with borderline findings, high risk, bilateral: Secondary | ICD-10-CM | POA: Diagnosis not present

## 2020-03-02 DIAGNOSIS — H47323 Drusen of optic disc, bilateral: Secondary | ICD-10-CM | POA: Diagnosis not present

## 2020-03-02 DIAGNOSIS — H35033 Hypertensive retinopathy, bilateral: Secondary | ICD-10-CM | POA: Diagnosis not present

## 2020-04-12 DIAGNOSIS — E785 Hyperlipidemia, unspecified: Secondary | ICD-10-CM | POA: Diagnosis not present

## 2020-04-12 DIAGNOSIS — N390 Urinary tract infection, site not specified: Secondary | ICD-10-CM | POA: Diagnosis not present

## 2020-04-12 DIAGNOSIS — E1140 Type 2 diabetes mellitus with diabetic neuropathy, unspecified: Secondary | ICD-10-CM | POA: Diagnosis not present

## 2020-04-12 DIAGNOSIS — I10 Essential (primary) hypertension: Secondary | ICD-10-CM | POA: Diagnosis not present

## 2020-04-17 DIAGNOSIS — L409 Psoriasis, unspecified: Secondary | ICD-10-CM | POA: Diagnosis not present

## 2020-04-17 DIAGNOSIS — R3129 Other microscopic hematuria: Secondary | ICD-10-CM | POA: Diagnosis not present

## 2020-04-17 DIAGNOSIS — K219 Gastro-esophageal reflux disease without esophagitis: Secondary | ICD-10-CM | POA: Diagnosis not present

## 2020-04-17 DIAGNOSIS — R600 Localized edema: Secondary | ICD-10-CM | POA: Diagnosis not present

## 2020-04-17 DIAGNOSIS — Z0001 Encounter for general adult medical examination with abnormal findings: Secondary | ICD-10-CM | POA: Diagnosis not present

## 2020-04-17 DIAGNOSIS — M159 Polyosteoarthritis, unspecified: Secondary | ICD-10-CM | POA: Diagnosis not present

## 2020-04-17 DIAGNOSIS — E1139 Type 2 diabetes mellitus with other diabetic ophthalmic complication: Secondary | ICD-10-CM | POA: Diagnosis not present

## 2020-04-17 DIAGNOSIS — M76899 Other specified enthesopathies of unspecified lower limb, excluding foot: Secondary | ICD-10-CM | POA: Diagnosis not present

## 2020-04-17 DIAGNOSIS — E1149 Type 2 diabetes mellitus with other diabetic neurological complication: Secondary | ICD-10-CM | POA: Diagnosis not present

## 2020-04-24 DIAGNOSIS — R311 Benign essential microscopic hematuria: Secondary | ICD-10-CM | POA: Diagnosis not present

## 2020-04-28 ENCOUNTER — Ambulatory Visit (INDEPENDENT_AMBULATORY_CARE_PROVIDER_SITE_OTHER): Payer: Medicare PPO | Admitting: Podiatry

## 2020-04-28 ENCOUNTER — Other Ambulatory Visit: Payer: Self-pay

## 2020-04-28 ENCOUNTER — Encounter: Payer: Self-pay | Admitting: Podiatry

## 2020-04-28 DIAGNOSIS — B351 Tinea unguium: Secondary | ICD-10-CM | POA: Diagnosis not present

## 2020-04-28 DIAGNOSIS — M2041 Other hammer toe(s) (acquired), right foot: Secondary | ICD-10-CM

## 2020-04-28 DIAGNOSIS — M79674 Pain in right toe(s): Secondary | ICD-10-CM | POA: Diagnosis not present

## 2020-04-28 DIAGNOSIS — M2042 Other hammer toe(s) (acquired), left foot: Secondary | ICD-10-CM

## 2020-04-28 DIAGNOSIS — E119 Type 2 diabetes mellitus without complications: Secondary | ICD-10-CM

## 2020-04-28 DIAGNOSIS — M79675 Pain in left toe(s): Secondary | ICD-10-CM | POA: Diagnosis not present

## 2020-04-28 NOTE — Progress Notes (Signed)
This patient returns to my office for at risk foot care.  This patient requires this care by a professional since this patient will be at risk due to having type 2 diabetes.  This patient is unable to cut nails herself since the patient cannot reach her nails.These nails are painful walking and wearing shoes.  This patient presents for at risk foot care today.  General Appearance  Alert, conversant and in no acute stress.  Vascular  Dorsalis pedis and posterior tibial  pulses are palpable  bilaterally.  Capillary return is within normal limits  bilaterally. Temperature is within normal limits  bilaterally.  Neurologic  Senn-Weinstein monofilament wire test within normal limits  bilaterally. Muscle power within normal limits bilaterally.  Nails Thick disfigured discolored nails with subungual debris  from hallux to fifth toes bilaterally. No evidence of bacterial infection or drainage bilaterally.  Orthopedic  No limitations of motion  feet .  No crepitus or effusions noted.  No bony pathology .  Hammer toes second  B/L.  Skin  normotropic skin with no porokeratosis noted bilaterally.  No signs of infections or ulcers noted.     Onychomycosis  Pain in right toes  Pain in left toes  Consent was obtained for treatment procedures.   Mechanical debridement of nails 1-5  bilaterally performed with a nail nipper.  Filed with dremel without incident.    Return office visit  3 months                   Told patient to return for periodic foot care and evaluation due to potential at risk complications.   Helane Gunther DPM

## 2020-05-15 DIAGNOSIS — K449 Diaphragmatic hernia without obstruction or gangrene: Secondary | ICD-10-CM | POA: Diagnosis not present

## 2020-05-15 DIAGNOSIS — I700 Atherosclerosis of aorta: Secondary | ICD-10-CM | POA: Diagnosis not present

## 2020-05-15 DIAGNOSIS — R311 Benign essential microscopic hematuria: Secondary | ICD-10-CM | POA: Diagnosis not present

## 2020-05-15 DIAGNOSIS — K862 Cyst of pancreas: Secondary | ICD-10-CM | POA: Diagnosis not present

## 2020-05-15 DIAGNOSIS — N838 Other noninflammatory disorders of ovary, fallopian tube and broad ligament: Secondary | ICD-10-CM | POA: Diagnosis not present

## 2020-05-29 DIAGNOSIS — R311 Benign essential microscopic hematuria: Secondary | ICD-10-CM | POA: Diagnosis not present

## 2020-05-30 DIAGNOSIS — E782 Mixed hyperlipidemia: Secondary | ICD-10-CM | POA: Diagnosis not present

## 2020-05-30 DIAGNOSIS — E119 Type 2 diabetes mellitus without complications: Secondary | ICD-10-CM | POA: Diagnosis not present

## 2020-05-30 DIAGNOSIS — Z794 Long term (current) use of insulin: Secondary | ICD-10-CM | POA: Diagnosis not present

## 2020-05-31 DIAGNOSIS — M8580 Other specified disorders of bone density and structure, unspecified site: Secondary | ICD-10-CM | POA: Diagnosis not present

## 2020-05-31 DIAGNOSIS — Z794 Long term (current) use of insulin: Secondary | ICD-10-CM | POA: Diagnosis not present

## 2020-05-31 DIAGNOSIS — E669 Obesity, unspecified: Secondary | ICD-10-CM | POA: Diagnosis not present

## 2020-05-31 DIAGNOSIS — E782 Mixed hyperlipidemia: Secondary | ICD-10-CM | POA: Diagnosis not present

## 2020-05-31 DIAGNOSIS — I10 Essential (primary) hypertension: Secondary | ICD-10-CM | POA: Diagnosis not present

## 2020-05-31 DIAGNOSIS — E113293 Type 2 diabetes mellitus with mild nonproliferative diabetic retinopathy without macular edema, bilateral: Secondary | ICD-10-CM | POA: Diagnosis not present

## 2020-05-31 DIAGNOSIS — E119 Type 2 diabetes mellitus without complications: Secondary | ICD-10-CM | POA: Diagnosis not present

## 2020-07-04 ENCOUNTER — Other Ambulatory Visit: Payer: Self-pay | Admitting: Internal Medicine

## 2020-07-04 DIAGNOSIS — Z1231 Encounter for screening mammogram for malignant neoplasm of breast: Secondary | ICD-10-CM

## 2020-08-04 ENCOUNTER — Other Ambulatory Visit: Payer: Self-pay

## 2020-08-04 ENCOUNTER — Ambulatory Visit: Payer: Medicare PPO | Admitting: Podiatry

## 2020-08-04 ENCOUNTER — Encounter: Payer: Self-pay | Admitting: Podiatry

## 2020-08-04 DIAGNOSIS — M79674 Pain in right toe(s): Secondary | ICD-10-CM

## 2020-08-04 DIAGNOSIS — M79675 Pain in left toe(s): Secondary | ICD-10-CM | POA: Diagnosis not present

## 2020-08-04 DIAGNOSIS — E119 Type 2 diabetes mellitus without complications: Secondary | ICD-10-CM | POA: Diagnosis not present

## 2020-08-04 DIAGNOSIS — B351 Tinea unguium: Secondary | ICD-10-CM

## 2020-08-04 NOTE — Progress Notes (Signed)
This patient returns to my office for at risk foot care.  This patient requires this care by a professional since this patient will be at risk due to having type 2 diabetes.  This patient is unable to cut nails herself since the patient cannot reach her nails.These nails are painful walking and wearing shoes.  This patient presents for at risk foot care today.  General Appearance  Alert, conversant and in no acute stress.  Vascular  Dorsalis pedis and posterior tibial  pulses are palpable  bilaterally.  Capillary return is within normal limits  bilaterally. Temperature is within normal limits  bilaterally.  Neurologic  Senn-Weinstein monofilament wire test within normal limits  bilaterally. Muscle power within normal limits bilaterally.  Nails Thick disfigured discolored nails with subungual debris  from hallux to fifth toes bilaterally. No evidence of bacterial infection or drainage bilaterally.  Orthopedic  No limitations of motion  feet .  No crepitus or effusions noted.  No bony pathology .  Hammer toes second  B/L.  Skin  normotropic skin with no porokeratosis noted bilaterally.  No signs of infections or ulcers noted.     Onychomycosis  Pain in right toes  Pain in left toes  Consent was obtained for treatment procedures.   Mechanical debridement of nails 1-5  bilaterally performed with a nail nipper.  Filed with dremel without incident.    Return office visit  3 months                   Told patient to return for periodic foot care and evaluation due to potential at risk complications.   Helane Gunther DPM

## 2020-08-21 ENCOUNTER — Ambulatory Visit: Payer: Medicare Other

## 2020-08-22 ENCOUNTER — Other Ambulatory Visit: Payer: Self-pay

## 2020-08-22 ENCOUNTER — Ambulatory Visit
Admission: RE | Admit: 2020-08-22 | Discharge: 2020-08-22 | Disposition: A | Discharge status: Home / Self Care | Payer: Medicare Other | Source: Ambulatory Visit | Attending: Internal Medicine | Admitting: Internal Medicine

## 2020-08-22 DIAGNOSIS — Z1231 Encounter for screening mammogram for malignant neoplasm of breast: Secondary | ICD-10-CM | POA: Diagnosis not present

## 2020-09-01 DIAGNOSIS — E119 Type 2 diabetes mellitus without complications: Secondary | ICD-10-CM | POA: Diagnosis not present

## 2020-09-01 DIAGNOSIS — Z794 Long term (current) use of insulin: Secondary | ICD-10-CM | POA: Diagnosis not present

## 2020-09-01 DIAGNOSIS — E782 Mixed hyperlipidemia: Secondary | ICD-10-CM | POA: Diagnosis not present

## 2020-09-06 DIAGNOSIS — H40023 Open angle with borderline findings, high risk, bilateral: Secondary | ICD-10-CM | POA: Diagnosis not present

## 2020-09-08 DIAGNOSIS — M8580 Other specified disorders of bone density and structure, unspecified site: Secondary | ICD-10-CM | POA: Diagnosis not present

## 2020-09-08 DIAGNOSIS — E669 Obesity, unspecified: Secondary | ICD-10-CM | POA: Diagnosis not present

## 2020-09-08 DIAGNOSIS — E782 Mixed hyperlipidemia: Secondary | ICD-10-CM | POA: Diagnosis not present

## 2020-09-08 DIAGNOSIS — I10 Essential (primary) hypertension: Secondary | ICD-10-CM | POA: Diagnosis not present

## 2020-09-08 DIAGNOSIS — Z794 Long term (current) use of insulin: Secondary | ICD-10-CM | POA: Diagnosis not present

## 2020-09-08 DIAGNOSIS — E119 Type 2 diabetes mellitus without complications: Secondary | ICD-10-CM | POA: Diagnosis not present

## 2020-11-10 ENCOUNTER — Encounter: Payer: Self-pay | Admitting: Podiatry

## 2020-11-10 ENCOUNTER — Other Ambulatory Visit: Payer: Self-pay

## 2020-11-10 ENCOUNTER — Ambulatory Visit: Payer: Medicare PPO | Admitting: Podiatry

## 2020-11-10 DIAGNOSIS — M2042 Other hammer toe(s) (acquired), left foot: Secondary | ICD-10-CM

## 2020-11-10 DIAGNOSIS — M79674 Pain in right toe(s): Secondary | ICD-10-CM | POA: Diagnosis not present

## 2020-11-10 DIAGNOSIS — M79675 Pain in left toe(s): Secondary | ICD-10-CM | POA: Diagnosis not present

## 2020-11-10 DIAGNOSIS — B351 Tinea unguium: Secondary | ICD-10-CM | POA: Diagnosis not present

## 2020-11-10 DIAGNOSIS — E119 Type 2 diabetes mellitus without complications: Secondary | ICD-10-CM

## 2020-11-10 DIAGNOSIS — M2041 Other hammer toe(s) (acquired), right foot: Secondary | ICD-10-CM

## 2020-11-10 NOTE — Progress Notes (Signed)
This patient returns to my office for at risk foot care.  This patient requires this care by a professional since this patient will be at risk due to having type 2 diabetes.  This patient is unable to cut nails herself since the patient cannot reach her nails.These nails are painful walking and wearing shoes.  This patient presents for at risk foot care today.  General Appearance  Alert, conversant and in no acute stress.  Vascular  Dorsalis pedis and posterior tibial  pulses are weakly  palpable  bilaterally.  Capillary return is within normal limits  bilaterally. Cold feet  Bilaterally. Absent digital hair  B/L. Neurologic  Senn-Weinstein monofilament wire test within normal limits  bilaterally. Muscle power within normal limits bilaterally.  Nails Thick disfigured discolored nails with subungual debris  from hallux to fifth toes bilaterally. No evidence of bacterial infection or drainage bilaterally.  Orthopedic  No limitations of motion  feet .  No crepitus or effusions noted.  No bony pathology .  Hammer toes second  B/L.  Skin  normotropic skin with no porokeratosis noted bilaterally.  No signs of infections or ulcers noted.     Onychomycosis  Pain in right toes  Pain in left toes  Consent was obtained for treatment procedures.   Mechanical debridement of nails 1-5  bilaterally performed with a nail nipper.  Filed with dremel without incident.    Return office visit  3 months                   Told patient to return for periodic foot care and evaluation due to potential at risk complications.   Helane Gunther DPM

## 2020-12-22 DIAGNOSIS — E782 Mixed hyperlipidemia: Secondary | ICD-10-CM | POA: Diagnosis not present

## 2020-12-22 DIAGNOSIS — E119 Type 2 diabetes mellitus without complications: Secondary | ICD-10-CM | POA: Diagnosis not present

## 2020-12-22 DIAGNOSIS — I10 Essential (primary) hypertension: Secondary | ICD-10-CM | POA: Diagnosis not present

## 2020-12-22 DIAGNOSIS — Z794 Long term (current) use of insulin: Secondary | ICD-10-CM | POA: Diagnosis not present

## 2020-12-29 DIAGNOSIS — I10 Essential (primary) hypertension: Secondary | ICD-10-CM | POA: Diagnosis not present

## 2020-12-29 DIAGNOSIS — E782 Mixed hyperlipidemia: Secondary | ICD-10-CM | POA: Diagnosis not present

## 2020-12-29 DIAGNOSIS — M8580 Other specified disorders of bone density and structure, unspecified site: Secondary | ICD-10-CM | POA: Diagnosis not present

## 2020-12-29 DIAGNOSIS — E11319 Type 2 diabetes mellitus with unspecified diabetic retinopathy without macular edema: Secondary | ICD-10-CM | POA: Diagnosis not present

## 2020-12-29 DIAGNOSIS — Z794 Long term (current) use of insulin: Secondary | ICD-10-CM | POA: Diagnosis not present

## 2020-12-29 DIAGNOSIS — E669 Obesity, unspecified: Secondary | ICD-10-CM | POA: Diagnosis not present

## 2020-12-29 DIAGNOSIS — Z6830 Body mass index (BMI) 30.0-30.9, adult: Secondary | ICD-10-CM | POA: Diagnosis not present

## 2021-02-09 ENCOUNTER — Encounter: Payer: Self-pay | Admitting: Podiatry

## 2021-02-09 ENCOUNTER — Ambulatory Visit: Payer: Medicare PPO | Admitting: Podiatry

## 2021-02-09 ENCOUNTER — Other Ambulatory Visit: Payer: Self-pay

## 2021-02-09 DIAGNOSIS — M79674 Pain in right toe(s): Secondary | ICD-10-CM

## 2021-02-09 DIAGNOSIS — M2041 Other hammer toe(s) (acquired), right foot: Secondary | ICD-10-CM

## 2021-02-09 DIAGNOSIS — M79675 Pain in left toe(s): Secondary | ICD-10-CM | POA: Diagnosis not present

## 2021-02-09 DIAGNOSIS — E119 Type 2 diabetes mellitus without complications: Secondary | ICD-10-CM

## 2021-02-09 DIAGNOSIS — B351 Tinea unguium: Secondary | ICD-10-CM

## 2021-02-09 DIAGNOSIS — M2042 Other hammer toe(s) (acquired), left foot: Secondary | ICD-10-CM

## 2021-02-09 NOTE — Progress Notes (Signed)
This patient returns to my office for at risk foot care.  This patient requires this care by a professional since this patient will be at risk due to having type 2 diabetes.  This patient is unable to cut nails herself since the patient cannot reach her nails.These nails are painful walking and wearing shoes.  This patient presents for at risk foot care today.  General Appearance  Alert, conversant and in no acute stress.  Vascular  Dorsalis pedis and posterior tibial  pulses are weakly  palpable  bilaterally.  Capillary return is within normal limits  bilaterally. Cold feet  Bilaterally. Absent digital hair  B/L. Neurologic  Senn-Weinstein monofilament wire test within normal limits  bilaterally. Muscle power within normal limits bilaterally.  Nails Thick disfigured discolored nails with subungual debris  from hallux to fifth toes bilaterally. No evidence of bacterial infection or drainage bilaterally.  Orthopedic  No limitations of motion  feet .  No crepitus or effusions noted.  No bony pathology .  Hammer toes second  B/L.  Skin  normotropic skin with no porokeratosis noted bilaterally.  No signs of infections or ulcers noted.     Onychomycosis  Pain in right toes  Pain in left toes  Consent was obtained for treatment procedures.   Mechanical debridement of nails 1-5  bilaterally performed with a nail nipper.  Filed with dremel without incident.    Return office visit  3 months                   Told patient to return for periodic foot care and evaluation due to potential at risk complications.   Helane Gunther DPM

## 2021-03-13 DIAGNOSIS — E782 Mixed hyperlipidemia: Secondary | ICD-10-CM | POA: Diagnosis not present

## 2021-03-13 DIAGNOSIS — Z794 Long term (current) use of insulin: Secondary | ICD-10-CM | POA: Diagnosis not present

## 2021-03-13 DIAGNOSIS — E119 Type 2 diabetes mellitus without complications: Secondary | ICD-10-CM | POA: Diagnosis not present

## 2021-03-13 DIAGNOSIS — I10 Essential (primary) hypertension: Secondary | ICD-10-CM | POA: Diagnosis not present

## 2021-04-05 DIAGNOSIS — H524 Presbyopia: Secondary | ICD-10-CM | POA: Diagnosis not present

## 2021-04-05 DIAGNOSIS — E119 Type 2 diabetes mellitus without complications: Secondary | ICD-10-CM | POA: Diagnosis not present

## 2021-04-05 DIAGNOSIS — H35033 Hypertensive retinopathy, bilateral: Secondary | ICD-10-CM | POA: Diagnosis not present

## 2021-04-05 DIAGNOSIS — H04123 Dry eye syndrome of bilateral lacrimal glands: Secondary | ICD-10-CM | POA: Diagnosis not present

## 2021-04-05 DIAGNOSIS — H40023 Open angle with borderline findings, high risk, bilateral: Secondary | ICD-10-CM | POA: Diagnosis not present

## 2021-04-13 DIAGNOSIS — I10 Essential (primary) hypertension: Secondary | ICD-10-CM | POA: Diagnosis not present

## 2021-04-13 DIAGNOSIS — Z7982 Long term (current) use of aspirin: Secondary | ICD-10-CM | POA: Diagnosis not present

## 2021-04-13 DIAGNOSIS — E785 Hyperlipidemia, unspecified: Secondary | ICD-10-CM | POA: Diagnosis not present

## 2021-04-13 DIAGNOSIS — E1140 Type 2 diabetes mellitus with diabetic neuropathy, unspecified: Secondary | ICD-10-CM | POA: Diagnosis not present

## 2021-04-23 DIAGNOSIS — I10 Essential (primary) hypertension: Secondary | ICD-10-CM | POA: Diagnosis not present

## 2021-04-23 DIAGNOSIS — Z6833 Body mass index (BMI) 33.0-33.9, adult: Secondary | ICD-10-CM | POA: Diagnosis not present

## 2021-04-23 DIAGNOSIS — Z0000 Encounter for general adult medical examination without abnormal findings: Secondary | ICD-10-CM | POA: Diagnosis not present

## 2021-04-23 DIAGNOSIS — R413 Other amnesia: Secondary | ICD-10-CM | POA: Diagnosis not present

## 2021-04-23 DIAGNOSIS — E785 Hyperlipidemia, unspecified: Secondary | ICD-10-CM | POA: Diagnosis not present

## 2021-04-23 DIAGNOSIS — E1149 Type 2 diabetes mellitus with other diabetic neurological complication: Secondary | ICD-10-CM | POA: Diagnosis not present

## 2021-04-24 ENCOUNTER — Other Ambulatory Visit: Payer: Self-pay | Admitting: Internal Medicine

## 2021-04-24 DIAGNOSIS — I10 Essential (primary) hypertension: Secondary | ICD-10-CM | POA: Diagnosis not present

## 2021-04-24 DIAGNOSIS — R0989 Other specified symptoms and signs involving the circulatory and respiratory systems: Secondary | ICD-10-CM

## 2021-04-24 DIAGNOSIS — Z794 Long term (current) use of insulin: Secondary | ICD-10-CM | POA: Diagnosis not present

## 2021-04-24 DIAGNOSIS — E113293 Type 2 diabetes mellitus with mild nonproliferative diabetic retinopathy without macular edema, bilateral: Secondary | ICD-10-CM | POA: Diagnosis not present

## 2021-04-24 DIAGNOSIS — E669 Obesity, unspecified: Secondary | ICD-10-CM | POA: Diagnosis not present

## 2021-04-24 DIAGNOSIS — M8580 Other specified disorders of bone density and structure, unspecified site: Secondary | ICD-10-CM | POA: Diagnosis not present

## 2021-04-24 DIAGNOSIS — E119 Type 2 diabetes mellitus without complications: Secondary | ICD-10-CM | POA: Diagnosis not present

## 2021-04-24 DIAGNOSIS — E1142 Type 2 diabetes mellitus with diabetic polyneuropathy: Secondary | ICD-10-CM | POA: Diagnosis not present

## 2021-04-24 DIAGNOSIS — E782 Mixed hyperlipidemia: Secondary | ICD-10-CM | POA: Diagnosis not present

## 2021-05-03 DIAGNOSIS — R311 Benign essential microscopic hematuria: Secondary | ICD-10-CM | POA: Diagnosis not present

## 2021-05-03 DIAGNOSIS — R8271 Bacteriuria: Secondary | ICD-10-CM | POA: Diagnosis not present

## 2021-05-07 ENCOUNTER — Ambulatory Visit
Admission: RE | Admit: 2021-05-07 | Discharge: 2021-05-07 | Disposition: A | Discharge status: Home / Self Care | Payer: Medicare PPO | Source: Ambulatory Visit | Attending: Internal Medicine | Admitting: Internal Medicine

## 2021-05-07 DIAGNOSIS — R0989 Other specified symptoms and signs involving the circulatory and respiratory systems: Secondary | ICD-10-CM

## 2021-05-07 DIAGNOSIS — E119 Type 2 diabetes mellitus without complications: Secondary | ICD-10-CM | POA: Diagnosis not present

## 2021-05-07 DIAGNOSIS — E7800 Pure hypercholesterolemia, unspecified: Secondary | ICD-10-CM | POA: Diagnosis not present

## 2021-05-07 DIAGNOSIS — I10 Essential (primary) hypertension: Secondary | ICD-10-CM | POA: Diagnosis not present

## 2021-05-18 ENCOUNTER — Encounter: Payer: Self-pay | Admitting: Podiatry

## 2021-05-18 ENCOUNTER — Other Ambulatory Visit: Payer: Self-pay

## 2021-05-18 ENCOUNTER — Ambulatory Visit: Payer: Medicare PPO | Admitting: Podiatry

## 2021-05-18 DIAGNOSIS — B351 Tinea unguium: Secondary | ICD-10-CM | POA: Diagnosis not present

## 2021-05-18 DIAGNOSIS — E119 Type 2 diabetes mellitus without complications: Secondary | ICD-10-CM

## 2021-05-18 DIAGNOSIS — M79674 Pain in right toe(s): Secondary | ICD-10-CM | POA: Diagnosis not present

## 2021-05-18 DIAGNOSIS — M2041 Other hammer toe(s) (acquired), right foot: Secondary | ICD-10-CM

## 2021-05-18 DIAGNOSIS — M2042 Other hammer toe(s) (acquired), left foot: Secondary | ICD-10-CM | POA: Diagnosis not present

## 2021-05-18 DIAGNOSIS — M79675 Pain in left toe(s): Secondary | ICD-10-CM

## 2021-05-18 NOTE — Progress Notes (Signed)
This patient returns to my office for at risk foot care.  This patient requires this care by a professional since this patient will be at risk due to having type 2 diabetes.  This patient is unable to cut nails herself since the patient cannot reach her nails.These nails are painful walking and wearing shoes.  This patient presents for at risk foot care today.  General Appearance  Alert, conversant and in no acute stress.  Vascular  Dorsalis pedis and posterior tibial  pulses are weakly  palpable  bilaterally.  Capillary return is within normal limits  bilaterally. Cold feet  Bilaterally. Absent digital hair  B/L. Neurologic  Senn-Weinstein monofilament wire test within normal limits  bilaterally. Muscle power within normal limits bilaterally.  Nails Thick disfigured discolored nails with subungual debris  from hallux to fifth toes bilaterally. No evidence of bacterial infection or drainage bilaterally.  Orthopedic  No limitations of motion  feet .  No crepitus or effusions noted.  No bony pathology .  Hammer toes second  B/L.  Skin  normotropic skin with no porokeratosis noted bilaterally.  No signs of infections or ulcers noted.     Onychomycosis  Pain in right toes  Pain in left toes  Consent was obtained for treatment procedures.   Mechanical debridement of nails 1-5  bilaterally performed with a nail nipper.  Filed with dremel without incident.    Return office visit  3 months                   Told patient to return for periodic foot care and evaluation due to potential at risk complications.   Caylin Raby DPM  

## 2021-06-25 DIAGNOSIS — E119 Type 2 diabetes mellitus without complications: Secondary | ICD-10-CM | POA: Diagnosis not present

## 2021-06-25 DIAGNOSIS — E669 Obesity, unspecified: Secondary | ICD-10-CM | POA: Diagnosis not present

## 2021-06-25 DIAGNOSIS — I10 Essential (primary) hypertension: Secondary | ICD-10-CM | POA: Diagnosis not present

## 2021-06-25 DIAGNOSIS — E785 Hyperlipidemia, unspecified: Secondary | ICD-10-CM | POA: Diagnosis not present

## 2021-06-25 DIAGNOSIS — M1990 Unspecified osteoarthritis, unspecified site: Secondary | ICD-10-CM | POA: Diagnosis not present

## 2021-06-25 DIAGNOSIS — K219 Gastro-esophageal reflux disease without esophagitis: Secondary | ICD-10-CM | POA: Diagnosis not present

## 2021-06-25 DIAGNOSIS — Z794 Long term (current) use of insulin: Secondary | ICD-10-CM | POA: Diagnosis not present

## 2021-06-25 DIAGNOSIS — K08409 Partial loss of teeth, unspecified cause, unspecified class: Secondary | ICD-10-CM | POA: Diagnosis not present

## 2021-06-25 DIAGNOSIS — Z604 Social exclusion and rejection: Secondary | ICD-10-CM | POA: Diagnosis not present

## 2021-07-18 DIAGNOSIS — E782 Mixed hyperlipidemia: Secondary | ICD-10-CM | POA: Diagnosis not present

## 2021-07-18 DIAGNOSIS — E669 Obesity, unspecified: Secondary | ICD-10-CM | POA: Diagnosis not present

## 2021-07-18 DIAGNOSIS — E119 Type 2 diabetes mellitus without complications: Secondary | ICD-10-CM | POA: Diagnosis not present

## 2021-07-18 DIAGNOSIS — Z794 Long term (current) use of insulin: Secondary | ICD-10-CM | POA: Diagnosis not present

## 2021-07-24 DIAGNOSIS — M159 Polyosteoarthritis, unspecified: Secondary | ICD-10-CM | POA: Diagnosis not present

## 2021-07-24 DIAGNOSIS — Z9989 Dependence on other enabling machines and devices: Secondary | ICD-10-CM | POA: Diagnosis not present

## 2021-07-24 DIAGNOSIS — Z0289 Encounter for other administrative examinations: Secondary | ICD-10-CM | POA: Diagnosis not present

## 2021-07-25 ENCOUNTER — Other Ambulatory Visit: Payer: Self-pay | Admitting: Internal Medicine

## 2021-07-25 DIAGNOSIS — Z1231 Encounter for screening mammogram for malignant neoplasm of breast: Secondary | ICD-10-CM

## 2021-07-27 DIAGNOSIS — N182 Chronic kidney disease, stage 2 (mild): Secondary | ICD-10-CM | POA: Diagnosis not present

## 2021-07-27 DIAGNOSIS — I6523 Occlusion and stenosis of bilateral carotid arteries: Secondary | ICD-10-CM | POA: Diagnosis not present

## 2021-07-27 DIAGNOSIS — I129 Hypertensive chronic kidney disease with stage 1 through stage 4 chronic kidney disease, or unspecified chronic kidney disease: Secondary | ICD-10-CM | POA: Diagnosis not present

## 2021-07-27 DIAGNOSIS — E785 Hyperlipidemia, unspecified: Secondary | ICD-10-CM | POA: Diagnosis not present

## 2021-07-27 DIAGNOSIS — E1122 Type 2 diabetes mellitus with diabetic chronic kidney disease: Secondary | ICD-10-CM | POA: Diagnosis not present

## 2021-07-27 DIAGNOSIS — E1169 Type 2 diabetes mellitus with other specified complication: Secondary | ICD-10-CM | POA: Diagnosis not present

## 2021-07-27 DIAGNOSIS — E1142 Type 2 diabetes mellitus with diabetic polyneuropathy: Secondary | ICD-10-CM | POA: Diagnosis not present

## 2021-07-27 DIAGNOSIS — E1151 Type 2 diabetes mellitus with diabetic peripheral angiopathy without gangrene: Secondary | ICD-10-CM | POA: Diagnosis not present

## 2021-07-27 DIAGNOSIS — E041 Nontoxic single thyroid nodule: Secondary | ICD-10-CM | POA: Diagnosis not present

## 2021-08-17 ENCOUNTER — Ambulatory Visit: Payer: Medicare PPO | Admitting: Podiatry

## 2021-08-31 ENCOUNTER — Ambulatory Visit
Admission: RE | Admit: 2021-08-31 | Discharge: 2021-08-31 | Disposition: A | Discharge status: Home / Self Care | Payer: Medicare PPO | Source: Ambulatory Visit | Attending: Internal Medicine | Admitting: Internal Medicine

## 2021-08-31 DIAGNOSIS — Z1231 Encounter for screening mammogram for malignant neoplasm of breast: Secondary | ICD-10-CM | POA: Diagnosis not present

## 2021-09-10 DIAGNOSIS — H40023 Open angle with borderline findings, high risk, bilateral: Secondary | ICD-10-CM | POA: Diagnosis not present

## 2021-09-13 DIAGNOSIS — E1151 Type 2 diabetes mellitus with diabetic peripheral angiopathy without gangrene: Secondary | ICD-10-CM | POA: Diagnosis not present

## 2021-09-13 DIAGNOSIS — E1169 Type 2 diabetes mellitus with other specified complication: Secondary | ICD-10-CM | POA: Diagnosis not present

## 2021-09-13 DIAGNOSIS — E1122 Type 2 diabetes mellitus with diabetic chronic kidney disease: Secondary | ICD-10-CM | POA: Diagnosis not present

## 2021-09-13 DIAGNOSIS — N182 Chronic kidney disease, stage 2 (mild): Secondary | ICD-10-CM | POA: Diagnosis not present

## 2021-09-13 DIAGNOSIS — I129 Hypertensive chronic kidney disease with stage 1 through stage 4 chronic kidney disease, or unspecified chronic kidney disease: Secondary | ICD-10-CM | POA: Diagnosis not present

## 2021-09-13 DIAGNOSIS — E1142 Type 2 diabetes mellitus with diabetic polyneuropathy: Secondary | ICD-10-CM | POA: Diagnosis not present

## 2021-09-13 DIAGNOSIS — E785 Hyperlipidemia, unspecified: Secondary | ICD-10-CM | POA: Diagnosis not present

## 2021-10-18 DIAGNOSIS — E663 Overweight: Secondary | ICD-10-CM | POA: Diagnosis not present

## 2021-10-18 DIAGNOSIS — Z0000 Encounter for general adult medical examination without abnormal findings: Secondary | ICD-10-CM | POA: Diagnosis not present

## 2021-10-18 DIAGNOSIS — I10 Essential (primary) hypertension: Secondary | ICD-10-CM | POA: Diagnosis not present

## 2021-10-18 DIAGNOSIS — Z6827 Body mass index (BMI) 27.0-27.9, adult: Secondary | ICD-10-CM | POA: Diagnosis not present

## 2021-10-18 DIAGNOSIS — E119 Type 2 diabetes mellitus without complications: Secondary | ICD-10-CM | POA: Diagnosis not present

## 2021-10-18 DIAGNOSIS — R2681 Unsteadiness on feet: Secondary | ICD-10-CM | POA: Diagnosis not present

## 2021-10-18 DIAGNOSIS — E785 Hyperlipidemia, unspecified: Secondary | ICD-10-CM | POA: Diagnosis not present

## 2021-10-30 DIAGNOSIS — E1151 Type 2 diabetes mellitus with diabetic peripheral angiopathy without gangrene: Secondary | ICD-10-CM | POA: Diagnosis not present

## 2021-10-30 DIAGNOSIS — I6523 Occlusion and stenosis of bilateral carotid arteries: Secondary | ICD-10-CM | POA: Diagnosis not present

## 2021-10-30 DIAGNOSIS — I129 Hypertensive chronic kidney disease with stage 1 through stage 4 chronic kidney disease, or unspecified chronic kidney disease: Secondary | ICD-10-CM | POA: Diagnosis not present

## 2021-10-30 DIAGNOSIS — E119 Type 2 diabetes mellitus without complications: Secondary | ICD-10-CM | POA: Diagnosis not present

## 2021-10-30 DIAGNOSIS — E1142 Type 2 diabetes mellitus with diabetic polyneuropathy: Secondary | ICD-10-CM | POA: Diagnosis not present

## 2021-10-30 DIAGNOSIS — N182 Chronic kidney disease, stage 2 (mild): Secondary | ICD-10-CM | POA: Diagnosis not present

## 2021-10-30 DIAGNOSIS — E1122 Type 2 diabetes mellitus with diabetic chronic kidney disease: Secondary | ICD-10-CM | POA: Diagnosis not present

## 2021-10-30 DIAGNOSIS — R351 Nocturia: Secondary | ICD-10-CM | POA: Diagnosis not present

## 2021-10-30 DIAGNOSIS — E1159 Type 2 diabetes mellitus with other circulatory complications: Secondary | ICD-10-CM | POA: Diagnosis not present

## 2021-10-30 DIAGNOSIS — I6529 Occlusion and stenosis of unspecified carotid artery: Secondary | ICD-10-CM | POA: Diagnosis not present

## 2021-10-30 DIAGNOSIS — E1169 Type 2 diabetes mellitus with other specified complication: Secondary | ICD-10-CM | POA: Diagnosis not present

## 2021-10-30 DIAGNOSIS — Z794 Long term (current) use of insulin: Secondary | ICD-10-CM | POA: Diagnosis not present

## 2021-10-31 ENCOUNTER — Encounter: Payer: Self-pay | Admitting: Podiatry

## 2021-10-31 ENCOUNTER — Other Ambulatory Visit: Payer: Self-pay

## 2021-10-31 ENCOUNTER — Ambulatory Visit: Payer: Medicare PPO | Admitting: Podiatry

## 2021-10-31 DIAGNOSIS — M2042 Other hammer toe(s) (acquired), left foot: Secondary | ICD-10-CM | POA: Diagnosis not present

## 2021-10-31 DIAGNOSIS — Z794 Long term (current) use of insulin: Secondary | ICD-10-CM

## 2021-10-31 DIAGNOSIS — B351 Tinea unguium: Secondary | ICD-10-CM | POA: Diagnosis not present

## 2021-10-31 DIAGNOSIS — M79674 Pain in right toe(s): Secondary | ICD-10-CM

## 2021-10-31 DIAGNOSIS — M2041 Other hammer toe(s) (acquired), right foot: Secondary | ICD-10-CM

## 2021-10-31 DIAGNOSIS — E119 Type 2 diabetes mellitus without complications: Secondary | ICD-10-CM | POA: Diagnosis not present

## 2021-10-31 DIAGNOSIS — M79675 Pain in left toe(s): Secondary | ICD-10-CM

## 2021-10-31 NOTE — Progress Notes (Signed)
This patient returns to my office for at risk foot care.  This patient requires this care by a professional since this patient will be at risk due to having type 2 diabetes.  This patient is unable to cut nails herself since the patient cannot reach her nails.These nails are painful walking and wearing shoes.  This patient presents for at risk foot care today.  General Appearance  Alert, conversant and in no acute stress.  Vascular  Dorsalis pedis and posterior tibial  pulses are weakly  palpable  bilaterally.  Capillary return is within normal limits  bilaterally. Cold feet  Bilaterally. Absent digital hair  B/L.  Neurologic  Senn-Weinstein monofilament wire test within normal limits  bilaterally. Muscle power within normal limits bilaterally.  Nails Thick disfigured discolored nails with subungual debris  from hallux to fifth toes bilaterally. No evidence of bacterial infection or drainage bilaterally.  Orthopedic  No limitations of motion  feet .  No crepitus or effusions noted.  No bony pathology .  Hammer toes second  B/L.  Skin  normotropic skin with no porokeratosis noted bilaterally.  No signs of infections or ulcers noted.     Onychomycosis  Pain in right toes  Pain in left toes  Consent was obtained for treatment procedures.   Mechanical debridement of nails 1-5  bilaterally performed with a nail nipper.  Filed with dremel without incident. Padding on second toe right foot.   Return office visit  3 months                   Told patient to return for periodic foot care and evaluation due to potential at risk complications.   Helane Gunther DPM

## 2021-11-15 DIAGNOSIS — Z794 Long term (current) use of insulin: Secondary | ICD-10-CM | POA: Diagnosis not present

## 2021-11-15 DIAGNOSIS — M8580 Other specified disorders of bone density and structure, unspecified site: Secondary | ICD-10-CM | POA: Diagnosis not present

## 2021-11-15 DIAGNOSIS — E785 Hyperlipidemia, unspecified: Secondary | ICD-10-CM | POA: Diagnosis not present

## 2021-11-15 DIAGNOSIS — E669 Obesity, unspecified: Secondary | ICD-10-CM | POA: Diagnosis not present

## 2021-11-15 DIAGNOSIS — G8929 Other chronic pain: Secondary | ICD-10-CM | POA: Diagnosis not present

## 2021-11-15 DIAGNOSIS — K219 Gastro-esophageal reflux disease without esophagitis: Secondary | ICD-10-CM | POA: Diagnosis not present

## 2021-11-15 DIAGNOSIS — E1151 Type 2 diabetes mellitus with diabetic peripheral angiopathy without gangrene: Secondary | ICD-10-CM | POA: Diagnosis not present

## 2021-11-15 DIAGNOSIS — I10 Essential (primary) hypertension: Secondary | ICD-10-CM | POA: Diagnosis not present

## 2021-11-15 DIAGNOSIS — M1990 Unspecified osteoarthritis, unspecified site: Secondary | ICD-10-CM | POA: Diagnosis not present

## 2021-12-19 DIAGNOSIS — E785 Hyperlipidemia, unspecified: Secondary | ICD-10-CM | POA: Diagnosis not present

## 2021-12-19 DIAGNOSIS — I10 Essential (primary) hypertension: Secondary | ICD-10-CM | POA: Diagnosis not present

## 2021-12-19 DIAGNOSIS — I70203 Unspecified atherosclerosis of native arteries of extremities, bilateral legs: Secondary | ICD-10-CM | POA: Diagnosis not present

## 2021-12-19 DIAGNOSIS — I509 Heart failure, unspecified: Secondary | ICD-10-CM | POA: Diagnosis not present

## 2021-12-19 DIAGNOSIS — Z79899 Other long term (current) drug therapy: Secondary | ICD-10-CM | POA: Diagnosis not present

## 2021-12-19 DIAGNOSIS — R2681 Unsteadiness on feet: Secondary | ICD-10-CM | POA: Diagnosis not present

## 2021-12-19 DIAGNOSIS — Z0001 Encounter for general adult medical examination with abnormal findings: Secondary | ICD-10-CM | POA: Diagnosis not present

## 2021-12-19 DIAGNOSIS — E663 Overweight: Secondary | ICD-10-CM | POA: Diagnosis not present

## 2021-12-19 DIAGNOSIS — E119 Type 2 diabetes mellitus without complications: Secondary | ICD-10-CM | POA: Diagnosis not present

## 2022-01-09 ENCOUNTER — Encounter: Payer: Self-pay | Admitting: Podiatry

## 2022-01-09 ENCOUNTER — Ambulatory Visit: Payer: Medicare PPO | Admitting: Podiatry

## 2022-01-09 DIAGNOSIS — I739 Peripheral vascular disease, unspecified: Secondary | ICD-10-CM

## 2022-01-09 DIAGNOSIS — E119 Type 2 diabetes mellitus without complications: Secondary | ICD-10-CM

## 2022-01-09 DIAGNOSIS — M2042 Other hammer toe(s) (acquired), left foot: Secondary | ICD-10-CM | POA: Diagnosis not present

## 2022-01-09 DIAGNOSIS — M79674 Pain in right toe(s): Secondary | ICD-10-CM | POA: Diagnosis not present

## 2022-01-09 DIAGNOSIS — M79675 Pain in left toe(s): Secondary | ICD-10-CM | POA: Diagnosis not present

## 2022-01-09 DIAGNOSIS — B351 Tinea unguium: Secondary | ICD-10-CM | POA: Diagnosis not present

## 2022-01-09 DIAGNOSIS — M2041 Other hammer toe(s) (acquired), right foot: Secondary | ICD-10-CM | POA: Diagnosis not present

## 2022-01-09 NOTE — Progress Notes (Signed)
This patient returns to my office for at risk foot care.  This patient requires this care by a professional since this patient will be at risk due to having type 2 diabetes.  This patient is unable to cut nails herself since the patient cannot reach her nails.These nails are painful walking and wearing shoes. She aslo says she is having pain in her second toes both feet.  She thirdly says she received a letter from her insurance company requesting vascular testing.  She says her medical doctor has switched so she has not seen her new PCP.   This patient presents for at risk foot care today. ? ?General Appearance  Alert, conversant and in no acute stress. ? ?Vascular  Dorsalis pedis and posterior tibial  pulses are weakly  palpable  bilaterally.  Capillary return is within normal limits  bilaterally. Cold feet  Bilaterally. Absent digital hair  B/L. ? ?Neurologic  Senn-Weinstein monofilament wire test within normal limits  bilaterally. Muscle power within normal limits bilaterally. ? ?Nails Thick disfigured discolored nails with subungual debris  from hallux to fifth toes bilaterally. No evidence of bacterial infection or drainage bilaterally. ? ?Orthopedic  No limitations of motion  feet .  No crepitus or effusions noted.  No bony pathology .  Hammer toes second  B/L. Mild  HAV  B/L. ? ?Skin  normotropic skin with no porokeratosis noted bilaterally.  No signs of infections or ulcers noted.   Her second toe right has thickened skin at site of second toe being rubbed from her bunion. ? ?Onychomycosis  Pain in right toes  Pain in left toes  Hammer toe second  B/L ? ?Consent was obtained for treatment procedures.   Mechanical debridement of nails 1-5  bilaterally performed with a nail nipper.  Filed with dremel without incident. Padding dispensed.  To order vascular testing for this patient so her new PCP can receive her results. ? ? ?Return office visit  3 months                   Told patient to return for periodic  foot care and evaluation due to potential at risk complications. ? ? ?Helane Gunther DPM  ?

## 2022-01-11 ENCOUNTER — Ambulatory Visit (HOSPITAL_COMMUNITY)
Admission: RE | Admit: 2022-01-11 | Discharge: 2022-01-11 | Disposition: A | Discharge status: Home / Self Care | Payer: Medicare PPO | Source: Ambulatory Visit | Attending: Podiatry | Admitting: Podiatry

## 2022-01-11 DIAGNOSIS — E119 Type 2 diabetes mellitus without complications: Secondary | ICD-10-CM | POA: Diagnosis not present

## 2022-01-11 DIAGNOSIS — I739 Peripheral vascular disease, unspecified: Secondary | ICD-10-CM

## 2022-01-16 DIAGNOSIS — I509 Heart failure, unspecified: Secondary | ICD-10-CM | POA: Diagnosis not present

## 2022-01-16 DIAGNOSIS — E119 Type 2 diabetes mellitus without complications: Secondary | ICD-10-CM | POA: Diagnosis not present

## 2022-01-16 DIAGNOSIS — I110 Hypertensive heart disease with heart failure: Secondary | ICD-10-CM | POA: Diagnosis not present

## 2022-01-16 DIAGNOSIS — E785 Hyperlipidemia, unspecified: Secondary | ICD-10-CM | POA: Diagnosis not present

## 2022-01-16 DIAGNOSIS — I70203 Unspecified atherosclerosis of native arteries of extremities, bilateral legs: Secondary | ICD-10-CM | POA: Diagnosis not present

## 2022-01-16 DIAGNOSIS — R2681 Unsteadiness on feet: Secondary | ICD-10-CM | POA: Diagnosis not present

## 2022-01-16 DIAGNOSIS — E663 Overweight: Secondary | ICD-10-CM | POA: Diagnosis not present

## 2022-01-16 DIAGNOSIS — Z23 Encounter for immunization: Secondary | ICD-10-CM | POA: Diagnosis not present

## 2022-01-16 DIAGNOSIS — Z6827 Body mass index (BMI) 27.0-27.9, adult: Secondary | ICD-10-CM | POA: Diagnosis not present

## 2022-01-17 ENCOUNTER — Other Ambulatory Visit: Payer: Self-pay | Admitting: Family Medicine

## 2022-01-17 DIAGNOSIS — Z780 Asymptomatic menopausal state: Secondary | ICD-10-CM

## 2022-01-18 DIAGNOSIS — N182 Chronic kidney disease, stage 2 (mild): Secondary | ICD-10-CM | POA: Diagnosis not present

## 2022-01-18 DIAGNOSIS — E785 Hyperlipidemia, unspecified: Secondary | ICD-10-CM | POA: Diagnosis not present

## 2022-01-18 DIAGNOSIS — E1122 Type 2 diabetes mellitus with diabetic chronic kidney disease: Secondary | ICD-10-CM | POA: Diagnosis not present

## 2022-01-18 DIAGNOSIS — E119 Type 2 diabetes mellitus without complications: Secondary | ICD-10-CM | POA: Diagnosis not present

## 2022-01-18 DIAGNOSIS — Z794 Long term (current) use of insulin: Secondary | ICD-10-CM | POA: Diagnosis not present

## 2022-01-18 DIAGNOSIS — M8580 Other specified disorders of bone density and structure, unspecified site: Secondary | ICD-10-CM | POA: Diagnosis not present

## 2022-01-18 DIAGNOSIS — I129 Hypertensive chronic kidney disease with stage 1 through stage 4 chronic kidney disease, or unspecified chronic kidney disease: Secondary | ICD-10-CM | POA: Diagnosis not present

## 2022-01-18 DIAGNOSIS — E1169 Type 2 diabetes mellitus with other specified complication: Secondary | ICD-10-CM | POA: Diagnosis not present

## 2022-01-29 DIAGNOSIS — I6523 Occlusion and stenosis of bilateral carotid arteries: Secondary | ICD-10-CM | POA: Diagnosis not present

## 2022-01-29 DIAGNOSIS — I129 Hypertensive chronic kidney disease with stage 1 through stage 4 chronic kidney disease, or unspecified chronic kidney disease: Secondary | ICD-10-CM | POA: Diagnosis not present

## 2022-01-29 DIAGNOSIS — E1142 Type 2 diabetes mellitus with diabetic polyneuropathy: Secondary | ICD-10-CM | POA: Diagnosis not present

## 2022-01-29 DIAGNOSIS — E1151 Type 2 diabetes mellitus with diabetic peripheral angiopathy without gangrene: Secondary | ICD-10-CM | POA: Diagnosis not present

## 2022-01-29 DIAGNOSIS — N182 Chronic kidney disease, stage 2 (mild): Secondary | ICD-10-CM | POA: Diagnosis not present

## 2022-01-29 DIAGNOSIS — E1122 Type 2 diabetes mellitus with diabetic chronic kidney disease: Secondary | ICD-10-CM | POA: Diagnosis not present

## 2022-01-29 DIAGNOSIS — E041 Nontoxic single thyroid nodule: Secondary | ICD-10-CM | POA: Diagnosis not present

## 2022-01-29 DIAGNOSIS — Z794 Long term (current) use of insulin: Secondary | ICD-10-CM | POA: Diagnosis not present

## 2022-02-06 ENCOUNTER — Ambulatory Visit: Payer: Medicare PPO | Admitting: Podiatry

## 2022-03-20 DIAGNOSIS — I509 Heart failure, unspecified: Secondary | ICD-10-CM | POA: Diagnosis not present

## 2022-03-20 DIAGNOSIS — E785 Hyperlipidemia, unspecified: Secondary | ICD-10-CM | POA: Diagnosis not present

## 2022-03-20 DIAGNOSIS — E119 Type 2 diabetes mellitus without complications: Secondary | ICD-10-CM | POA: Diagnosis not present

## 2022-03-20 DIAGNOSIS — Z23 Encounter for immunization: Secondary | ICD-10-CM | POA: Diagnosis not present

## 2022-03-20 DIAGNOSIS — I110 Hypertensive heart disease with heart failure: Secondary | ICD-10-CM | POA: Diagnosis not present

## 2022-03-20 DIAGNOSIS — I70203 Unspecified atherosclerosis of native arteries of extremities, bilateral legs: Secondary | ICD-10-CM | POA: Diagnosis not present

## 2022-03-20 DIAGNOSIS — M25562 Pain in left knee: Secondary | ICD-10-CM | POA: Diagnosis not present

## 2022-03-20 DIAGNOSIS — E663 Overweight: Secondary | ICD-10-CM | POA: Diagnosis not present

## 2022-03-20 DIAGNOSIS — R2681 Unsteadiness on feet: Secondary | ICD-10-CM | POA: Diagnosis not present

## 2022-03-25 ENCOUNTER — Ambulatory Visit
Admission: RE | Admit: 2022-03-25 | Discharge: 2022-03-25 | Disposition: A | Discharge status: Home / Self Care | Payer: Medicare PPO | Source: Ambulatory Visit | Attending: Family Medicine | Admitting: Family Medicine

## 2022-03-25 ENCOUNTER — Other Ambulatory Visit: Payer: Self-pay | Admitting: Family Medicine

## 2022-03-25 DIAGNOSIS — M25562 Pain in left knee: Secondary | ICD-10-CM

## 2022-03-25 DIAGNOSIS — M1712 Unilateral primary osteoarthritis, left knee: Secondary | ICD-10-CM | POA: Diagnosis not present

## 2022-03-25 DIAGNOSIS — G8929 Other chronic pain: Secondary | ICD-10-CM | POA: Diagnosis not present

## 2022-04-11 DIAGNOSIS — H40023 Open angle with borderline findings, high risk, bilateral: Secondary | ICD-10-CM | POA: Diagnosis not present

## 2022-04-11 DIAGNOSIS — H26493 Other secondary cataract, bilateral: Secondary | ICD-10-CM | POA: Diagnosis not present

## 2022-04-11 DIAGNOSIS — H47323 Drusen of optic disc, bilateral: Secondary | ICD-10-CM | POA: Diagnosis not present

## 2022-04-11 DIAGNOSIS — E119 Type 2 diabetes mellitus without complications: Secondary | ICD-10-CM | POA: Diagnosis not present

## 2022-04-12 ENCOUNTER — Ambulatory Visit: Payer: Medicare PPO | Admitting: Podiatry

## 2022-04-12 ENCOUNTER — Encounter: Payer: Self-pay | Admitting: Podiatry

## 2022-04-12 DIAGNOSIS — B351 Tinea unguium: Secondary | ICD-10-CM | POA: Diagnosis not present

## 2022-04-12 DIAGNOSIS — M79675 Pain in left toe(s): Secondary | ICD-10-CM

## 2022-04-12 DIAGNOSIS — E119 Type 2 diabetes mellitus without complications: Secondary | ICD-10-CM

## 2022-04-12 DIAGNOSIS — M2042 Other hammer toe(s) (acquired), left foot: Secondary | ICD-10-CM

## 2022-04-12 DIAGNOSIS — M79674 Pain in right toe(s): Secondary | ICD-10-CM | POA: Diagnosis not present

## 2022-04-12 DIAGNOSIS — I739 Peripheral vascular disease, unspecified: Secondary | ICD-10-CM | POA: Diagnosis not present

## 2022-04-12 DIAGNOSIS — M2041 Other hammer toe(s) (acquired), right foot: Secondary | ICD-10-CM

## 2022-04-12 NOTE — Progress Notes (Signed)
This patient returns to my office for at risk foot care.  This patient requires this care by a professional since this patient will be at risk due to having type 2 diabetes.  This patient is unable to cut nails herself since the patient cannot reach her nails.These nails are painful walking and wearing shoes.  This patient presents for at risk foot care today.  General Appearance  Alert, conversant and in no acute stress.  Vascular  Dorsalis pedis and posterior tibial  pulses are weakly  palpable  bilaterally.  Capillary return is within normal limits  bilaterally. Cold feet  Bilaterally. Absent digital hair  B/L.  Neurologic  Senn-Weinstein monofilament wire test within normal limits  bilaterally. Muscle power within normal limits bilaterally.  Nails Thick disfigured discolored nails with subungual debris  from hallux to fifth toes bilaterally. No evidence of bacterial infection or drainage bilaterally.  Orthopedic  No limitations of motion  feet .  No crepitus or effusions noted.  No bony pathology .  Hammer toes second  B/L.  Skin  normotropic skin with no porokeratosis noted bilaterally.  No signs of infections or ulcers noted.     Onychomycosis  Pain in right toes  Pain in left toes  Consent was obtained for treatment procedures.   Mechanical debridement of nails 1-5  bilaterally performed with a nail nipper.  Filed with dremel without incident.  Checked her vascular study and there was no vascular pathology.   Return office visit  3 months                   Told patient to return for periodic foot care and evaluation due to potential at risk complications.   Jamillia Closson DPM  

## 2022-07-08 ENCOUNTER — Inpatient Hospital Stay: Admission: RE | Admit: 2022-07-08 | Payer: Medicare PPO | Source: Ambulatory Visit

## 2022-07-09 ENCOUNTER — Other Ambulatory Visit: Payer: Self-pay | Admitting: Family Medicine

## 2022-07-09 DIAGNOSIS — Z1231 Encounter for screening mammogram for malignant neoplasm of breast: Secondary | ICD-10-CM

## 2022-07-19 ENCOUNTER — Ambulatory Visit: Payer: Medicare PPO | Admitting: Podiatry

## 2022-07-19 ENCOUNTER — Encounter: Payer: Self-pay | Admitting: Podiatry

## 2022-07-19 DIAGNOSIS — M79675 Pain in left toe(s): Secondary | ICD-10-CM

## 2022-07-19 DIAGNOSIS — M2041 Other hammer toe(s) (acquired), right foot: Secondary | ICD-10-CM

## 2022-07-19 DIAGNOSIS — M79674 Pain in right toe(s): Secondary | ICD-10-CM

## 2022-07-19 DIAGNOSIS — I739 Peripheral vascular disease, unspecified: Secondary | ICD-10-CM

## 2022-07-19 DIAGNOSIS — E119 Type 2 diabetes mellitus without complications: Secondary | ICD-10-CM | POA: Diagnosis not present

## 2022-07-19 DIAGNOSIS — B351 Tinea unguium: Secondary | ICD-10-CM

## 2022-07-19 DIAGNOSIS — M2042 Other hammer toe(s) (acquired), left foot: Secondary | ICD-10-CM

## 2022-07-19 NOTE — Progress Notes (Signed)
This patient returns to my office for at risk foot care.  This patient requires this care by a professional since this patient will be at risk due to having type 2 diabetes.  This patient is unable to cut nails herself since the patient cannot reach her nails.These nails are painful walking and wearing shoes.  This patient presents for at risk foot care today.  General Appearance  Alert, conversant and in no acute stress.  Vascular  Dorsalis pedis and posterior tibial  pulses are weakly  palpable  bilaterally.  Capillary return is within normal limits  bilaterally. Cold feet  Bilaterally. Absent digital hair  B/L.  Neurologic  Senn-Weinstein monofilament wire test within normal limits  bilaterally. Muscle power within normal limits bilaterally.  Nails Thick disfigured discolored nails with subungual debris  from hallux to fifth toes bilaterally. No evidence of bacterial infection or drainage bilaterally.  Orthopedic  No limitations of motion  feet .  No crepitus or effusions noted.  No bony pathology .  Hammer toes second  B/L.  Skin  normotropic skin with no porokeratosis noted bilaterally.  No signs of infections or ulcers noted.     Onychomycosis  Pain in right toes  Pain in left toes  Consent was obtained for treatment procedures.   Mechanical debridement of nails 1-5  bilaterally performed with a nail nipper.  Filed with dremel without incident.  Checked her vascular study and there was no vascular pathology.   Return office visit  3 months                   Told patient to return for periodic foot care and evaluation due to potential at risk complications.   Gardiner Barefoot DPM

## 2022-09-02 ENCOUNTER — Ambulatory Visit
Admission: RE | Admit: 2022-09-02 | Discharge: 2022-09-02 | Disposition: A | Discharge status: Home / Self Care | Payer: Medicare PPO | Source: Ambulatory Visit | Attending: Family Medicine | Admitting: Family Medicine

## 2022-09-02 DIAGNOSIS — Z1231 Encounter for screening mammogram for malignant neoplasm of breast: Secondary | ICD-10-CM

## 2022-10-25 ENCOUNTER — Ambulatory Visit: Payer: Medicare PPO | Admitting: Podiatry

## 2022-10-31 ENCOUNTER — Ambulatory Visit (HOSPITAL_BASED_OUTPATIENT_CLINIC_OR_DEPARTMENT_OTHER)
Admission: RE | Admit: 2022-10-31 | Discharge: 2022-10-31 | Disposition: A | Discharge status: Home / Self Care | Payer: Medicare PPO | Source: Ambulatory Visit | Attending: Family Medicine | Admitting: Family Medicine

## 2022-10-31 ENCOUNTER — Other Ambulatory Visit (HOSPITAL_BASED_OUTPATIENT_CLINIC_OR_DEPARTMENT_OTHER): Payer: Self-pay | Admitting: Family Medicine

## 2022-10-31 DIAGNOSIS — I10 Essential (primary) hypertension: Secondary | ICD-10-CM | POA: Diagnosis present

## 2022-12-13 ENCOUNTER — Ambulatory Visit
Admission: RE | Admit: 2022-12-13 | Discharge: 2022-12-13 | Disposition: A | Discharge status: Home / Self Care | Payer: Medicare PPO | Source: Ambulatory Visit | Attending: Family Medicine | Admitting: Family Medicine

## 2022-12-13 DIAGNOSIS — Z780 Asymptomatic menopausal state: Secondary | ICD-10-CM

## 2022-12-30 ENCOUNTER — Ambulatory Visit (INDEPENDENT_AMBULATORY_CARE_PROVIDER_SITE_OTHER): Payer: Medicare PPO

## 2022-12-30 ENCOUNTER — Ambulatory Visit (HOSPITAL_COMMUNITY)
Admission: EM | Admit: 2022-12-30 | Discharge: 2022-12-30 | Disposition: A | Discharge status: Home / Self Care | Payer: Medicare PPO | Attending: Physician Assistant | Admitting: Physician Assistant

## 2022-12-30 ENCOUNTER — Encounter (HOSPITAL_COMMUNITY): Payer: Self-pay | Admitting: Emergency Medicine

## 2022-12-30 DIAGNOSIS — I10 Essential (primary) hypertension: Secondary | ICD-10-CM

## 2022-12-30 DIAGNOSIS — W19XXXA Unspecified fall, initial encounter: Secondary | ICD-10-CM | POA: Diagnosis not present

## 2022-12-30 DIAGNOSIS — M25562 Pain in left knee: Secondary | ICD-10-CM

## 2022-12-30 DIAGNOSIS — S8002XA Contusion of left knee, initial encounter: Secondary | ICD-10-CM | POA: Diagnosis not present

## 2022-12-30 DIAGNOSIS — R030 Elevated blood-pressure reading, without diagnosis of hypertension: Secondary | ICD-10-CM

## 2022-12-30 MED ORDER — LIDOCAINE 5 % EX OINT: 1.0000 | TOPICAL_OINTMENT | Freq: Three times a day (TID) | CUTANEOUS | 0 refills | Status: DC | PRN

## 2022-12-30 NOTE — ED Provider Notes (Signed)
[facility]    CSN: [id number] Arrival date & time: 12/30/22  1042      History   Chief Complaint Chief Complaint  Patient presents with   Knee Pain    HPI Karen [name] is a 89 y.o. female.   Patient presents today for evaluation of left knee pain.  Reports that over a week ago she went out to get her newspaper when she took a misstep and fell forward onto her knee.  She has had ongoing pain since that time.  She did not hit her head and denies any loss of consciousness, headache, dizziness, nausea, vomiting.  She does not take blood thinning medication outside of a baby aspirin.  She reports that pain is rated 6 on a 0-10 pain scale, described as aching, worse with certain movements, no alleviating factors identified.  Denies any previous injury or surgery involving her knee.  Denies any difficulty ambulating or additional falls since recent fall.  She has been taking Tylenol as well as topical medications with minimal improvement of symptoms.  Her blood pressure was elevated today.  She reports she takes her medication typically at night.  She checked her blood pressure before she came and this was more appropriate at 130/80.  She denies any chest pain, shortness of breath, headache, vision change, dizziness.  She is not taking NSAIDs regularly and denies any recent increased sodium or caffeine consumption.    Past Medical History:  Diagnosis Date   Back pain    Diabetes mellitus without complication     Patient Active Problem List   Diagnosis Date Noted   Mixed dyslipidemia 11/08/2016   Osteopenia 11/08/2016   Type 2 diabetes mellitus without complication, with long-term current use of insulin 11/08/2016   Intracranial bleed 06/29/2016   Fall 06/29/2016   Syncope and collapse 06/29/2016   Diabetes 06/29/2016   HTN (hypertension) 06/29/2016    Past Surgical History:  Procedure Laterality Date   ABDOMINAL HYSTERECTOMY     COLON SURGERY  2005   blockage     OB History   No obstetric history on file.      Home Medications    Prior to Admission medications   Medication Sig Start Date End Date Taking? Authorizing Provider  lidocaine (XYLOCAINE) 5 % ointment Apply 1 Application topically 3 (three) times daily as needed. 12/30/22  Yes Madalynne Gutmann K, PA-C  Accu-Chek Softclix Lancets lancets Use to test blood sugar 4 times a day (Dx:  E11.65) 10/08/19   [provider]  acetaminophen (TYLENOL) 500 MG tablet Take 2 tablets 3 times a day as directed as needed    [provider]  Alcohol Swabs (ALCOHOL WIPES) 70 % PADS Use as directed for insulin injections and to test blood sugar 4 times a day (Dx:  E11.65) 10/08/19   [provider]  amoxicillin (AMOXIL) 500 MG capsule TAKE 1 CAPSULE BY MOUTH THREE TIMES A DAY UNTIL GONE 07/07/18   [provider]  Ascorbic Acid (VITAMIN C) 100 MG tablet Take 100 mg by mouth daily.    [provider]  aspirin 81 MG tablet Take 81 mg by mouth daily.    [provider]  B-D UF III MINI PEN NEEDLES 31G X 5 MM MISC  06/16/20   [provider]  Blood Glucose Calibration (ACCU-CHEK AVIVA) SOLN Use as directed 10/11/19   [provider]  Blood Glucose Monitoring Suppl (GLUCOCOM BLOOD GLUCOSE MONITOR) DEVI Use to test blood sugar  as directed (Dx:  E11.65) 10/08/19   [provider]  Calcium Carb-Cholecalciferol (CALCIUM-VITAMIN D) 500-200 MG-UNIT tablet Take 2 tablets once a day    [provider]  calcium-vitamin D (OSCAL WITH D) 500-200 MG-UNIT TABS tablet Take 1 tablet once a day    [provider]  Cholecalciferol 25 MCG (1000 UT) tablet Take 1 tablet by mouth daily.    [provider]  Cod Liver Oil CAPS Take 1 capsule once a day    [provider]  diclofenac (VOLTAREN) 50 MG EC tablet TAKE 1 TABLET BY MOUTH 3 TIMES A DAY WITH MEAL OR SNACK AS NEEDED FOR INFLAMMATION/PAIN 04/06/18   Jessy Oto, MD  Garlic  [id number] MG TABS Take 1 tablet once a day    [provider]  glucose blood (ACCU-CHEK AVIVA PLUS) test strip Use to test blood sugar 4 times a day (Dx:  E11.65) 10/08/19   [provider]  HUMALOG MIX 75/25 KWIKPEN (75-25) 100 UNIT/ML Claiborne Rigg  10/01/18   [provider]  insulin aspart protamine - aspart (NOVOLOG MIX 70/30 FLEXPEN) (70-30) 100 UNIT/ML FlexPen Inject 9 units before breakfast - 6 units before supper for diabetes 10/04/19   [provider]  Multiple Vitamins-Minerals (MULTI COMPLETE PO) Take 1 tablet by mouth daily.     [provider]  quinapril (ACCUPRIL) 40 MG tablet Take 1/2 tablet once a day for blood pressure 11/29/19   [provider]  rosuvastatin (CRESTOR) 5 MG tablet Take 5 mg by mouth daily. 04/12/19   [provider]  traMADol (ULTRAM) 50 MG tablet TAKE 1 TABLET EVERY 6 HOURS AS NEEDED FOR UP TO 10 DAYS FOR MODERATE PAIN 04/20/19   Jessy Oto, MD  triamcinolone ointment (KENALOG) 0.5 % 1 APPLICATION TWICE A DAY EXTERNALLY 14 DAYS 03/16/19   [provider]  UNABLE TO FIND Take 2 tablets once a day    [provider]  UNABLE TO FIND Take 1 tablet once a day    [provider]  UNABLE TO FIND Use to test blood sugars 3 times a day (Dx: E11.9)    [provider]    Family History History reviewed. No pertinent family history.  Social History Social History   Tobacco Use   Smoking status: Never   Smokeless tobacco: Never  Substance Use Topics   Alcohol use: No   Drug use: No     Allergies   Etodolac, Hydrochlorothiazide, Naproxen, Other, and Codeine   Review of Systems Review of Systems  Constitutional:  Positive for activity change. Negative for appetite change, fatigue and fever.  Eyes:  Negative for photophobia and visual disturbance.  Gastrointestinal:  Negative for abdominal pain, diarrhea, nausea and vomiting.  Musculoskeletal:  Positive for arthralgias, gait  problem and joint swelling. Negative for myalgias.  Skin:  Positive for color change (Bruising). Negative for wound.  Neurological:  Negative for dizziness, weakness, light-headedness, numbness and headaches.     Physical Exam Triage Vital Signs ED Triage Vitals  Enc Vitals Group     BP 12/30/22 1304 (!) 175/80     Pulse Rate 12/30/22 1304 70     Resp 12/30/22 1304 20     Temp 12/30/22 1304 97.9 F (36.6 C)     Temp Source 12/30/22 1304 Oral     SpO2 12/30/22 1304 97 %     Weight --      Height --      Head Circumference --  Peak Flow --      Pain Score 12/30/22 1303 5     Pain Loc --      Pain Edu? --      Excl. in [place]? --    No data found.  Updated Vital Signs BP (!) 174/94 (BP Location: Left Arm)   Pulse 70   Temp 97.9 F (36.6 C) (Oral)   Resp 20   SpO2 97%   Visual Acuity Right Eye Distance:   Left Eye Distance:   Bilateral Distance:    Right Eye Near:   Left Eye Near:    Bilateral Near:     Physical Exam Vitals reviewed.  Constitutional:      General: She is awake. She is not in acute distress.    Appearance: Normal appearance. She is well-developed. She is not ill-appearing.     Comments: Very pleasant female appears stated age in no acute distress sitting comfortably in exam room  HENT:     Head: Normocephalic and atraumatic.  Cardiovascular:     Rate and Rhythm: Normal rate and regular rhythm.     Heart sounds: Normal heart sounds, S1 normal and S2 normal. No murmur heard. Pulmonary:     Effort: Pulmonary effort is normal.     Breath sounds: Normal breath sounds. No wheezing, rhonchi or rales.     Comments: Clear to auscultation bilaterally Musculoskeletal:     Left knee: Swelling and ecchymosis present. No deformity or effusion. Normal range of motion. Tenderness present over the medial joint line. No lateral joint line tenderness. No LCL laxity, MCL laxity, ACL laxity or PCL laxity.    Instability Tests: Anterior drawer test negative.  Posterior drawer test negative.     Comments: Left knee: Swelling and bruising noted anterior left knee.  Normal active range of motion.  Mild tenderness palpation over medial joint line.  Psychiatric:        Behavior: Behavior is cooperative.      UC Treatments / Results  Labs (all labs ordered are listed, but only abnormal results are displayed) Labs Reviewed - No data to display  EKG   Radiology DG Knee Complete 4 Views Left  Result Date: 12/30/2022 CLINICAL DATA:  Pain and swelling.  Found hurt left knee 1 week ago. EXAM: LEFT KNEE - COMPLETE 4+ VIEW COMPARISON:  Left knee radiographs 03/25/2022 FINDINGS: There is diffuse decreased bone mineralization. There is severe medial compartment joint space narrowing with subchondral sclerosis moderate peripheral osteophytosis. Mild peripheral lateral component degenerative osteophytes with preserved joint space. Small joint effusion is mildly increased from prior. Moderate to large superior and moderate inferior patellar degenerative osteophytes are unchanged. At least moderate patellofemoral joint space narrowing. Mild chronic enthesopathic change at the quadriceps insertion on the patella. No definite acute fracture is seen. No dislocation. IMPRESSION: 1. Severe medial compartment and moderate patellofemoral compartment osteoarthritis. 2. Small joint effusion. 3. No acute fracture. Electronically Signed   By: Yvonne Kendall M.D.   On: 12/30/2022 13:57    Procedures Procedures (including critical care time)  Medications Ordered in UC Medications - No data to display  Initial Impression / Assessment and Plan / UC Course  I have reviewed the triage vital signs and the nursing notes.  Pertinent labs & imaging results that were available during my care of the patient were reviewed by me and considered in my medical decision making (see chart for details).     Patient is well-appearing, afebrile, nontoxic, nontachycardic.  Patient denies any  associated head trauma and fall occurred over 1 week ago and reports that she has not had any ongoing head trauma symptoms no indication for emergent evaluation or imaging today.  X-ray of knee was obtained that showed osteoarthritis without acute osseous abnormality.  She was encouraged to continue her Tylenol and use topical lidocaine to help with her symptoms.  She is to keep it elevated and use ice.  Discussed that if her symptoms or not improving she should follow-up with sports medicine was given contact information for local provider with instruction to call to schedule an appointment.  If she has any worsening symptoms she is to be reevaluated.  Blood pressure is elevated today.  She denies any signs or symptoms of endorgan damage.  She will take her medication and monitor her blood pressure at home.  Recommend that she follow-up with her primary care for blood pressure recheck and medication adjustment within a week.  If she develops any worsening symptoms she needs to be seen immediately.  Final Clinical Impressions(s) / UC Diagnoses   Final diagnoses:  Contusion of left knee, initial encounter  Fall, initial encounter  Elevated blood pressure reading     Discharge Instructions      Your x-ray showed that you have arthritis.  Continue your Tylenol regularly.  Keep your knee elevated and use ice.  Apply lidocaine for pain relief.  If your symptoms not improving please follow-up with sports medicine; call to schedule an appointment.  If anything worsens return for reevaluation.  Your blood pressure was elevated.  Monitor this at home.  If you develop any chest pain, shortness of breath, headache, vision change, dizziness you should be seen immediately.  Follow-up with your primary care within a week.     ED Prescriptions     Medication Sig Dispense Auth. Provider   lidocaine (XYLOCAINE) 5 % ointment Apply 1 Application topically 3 (three) times daily as needed. 100 g Deovion Batrez K,  PA-C      PDMP not reviewed this encounter.   Terrilee Croak, PA-C 12/30/22 1422

## 2022-12-30 NOTE — Discharge Instructions (Signed)
Your x-ray showed that you have arthritis.  Continue your Tylenol regularly.  Keep your knee elevated and use ice.  Apply lidocaine for pain relief.  If your symptoms not improving please follow-up with sports medicine; call to schedule an appointment.  If anything worsens return for reevaluation.  Your blood pressure was elevated.  Monitor this at home.  If you develop any chest pain, shortness of breath, headache, vision change, dizziness you should be seen immediately.  Follow-up with your primary care within a week.

## 2022-12-30 NOTE — ED Triage Notes (Signed)
Pt fell and hurt left knee a week ago. Pt reports that putting cream and stuff on knee to try to help with pain but not helping. Pt able to ambulate with cain without assistance. Called PCP office yesterday bc pain was so bad, advised to go get xray on knee. Denies bruising, swelling or open skin.

## 2023-01-15 ENCOUNTER — Encounter: Payer: Self-pay | Admitting: Podiatry

## 2023-01-15 ENCOUNTER — Ambulatory Visit: Payer: Medicare PPO | Admitting: Podiatry

## 2023-01-15 VITALS — BP 183/85 | HR 69

## 2023-01-15 DIAGNOSIS — E119 Type 2 diabetes mellitus without complications: Secondary | ICD-10-CM

## 2023-01-15 DIAGNOSIS — I739 Peripheral vascular disease, unspecified: Secondary | ICD-10-CM | POA: Diagnosis not present

## 2023-01-15 DIAGNOSIS — M79675 Pain in left toe(s): Secondary | ICD-10-CM

## 2023-01-15 DIAGNOSIS — B351 Tinea unguium: Secondary | ICD-10-CM

## 2023-01-15 DIAGNOSIS — M79674 Pain in right toe(s): Secondary | ICD-10-CM

## 2023-01-15 NOTE — Progress Notes (Signed)
This patient returns to my office for at risk foot care.  This patient requires this care by a professional since this patient will be at risk due to having type 2 diabetes.  This patient is unable to cut nails herself since the patient cannot reach her nails.These nails are painful walking and wearing shoes.  This patient presents for at risk foot care today.  General Appearance  Alert, conversant and in no acute stress.  Vascular  Dorsalis pedis and posterior tibial  pulses are weakly  palpable  bilaterally.  Capillary return is within normal limits  bilaterally. Cold feet  Bilaterally. Absent digital hair  B/L.  Neurologic  Senn-Weinstein monofilament wire test within normal limits  bilaterally. Muscle power within normal limits bilaterally.  Nails Thick disfigured discolored nails with subungual debris  from hallux to fifth toes bilaterally. No evidence of bacterial infection or drainage bilaterally.  Orthopedic  No limitations of motion  feet .  No crepitus or effusions noted.  No bony pathology .  Hammer toes second  B/L.  Skin  normotropic skin with no porokeratosis noted bilaterally.  No signs of infections or ulcers noted.     Onychomycosis  Pain in right toes  Pain in left toes  Consent was obtained for treatment procedures.   Mechanical debridement of nails 1-5  bilaterally performed with a nail nipper.  Filed with dremel without incident.     Return office visit  4 months                   Told patient to return for periodic foot care and evaluation due to potential at risk complications.   Helane Gunther DPM

## 2023-02-22 ENCOUNTER — Emergency Department (HOSPITAL_COMMUNITY): Payer: Medicare PPO

## 2023-02-22 ENCOUNTER — Encounter (HOSPITAL_COMMUNITY): Payer: Self-pay

## 2023-02-22 ENCOUNTER — Emergency Department (HOSPITAL_COMMUNITY)
Admission: EM | Admit: 2023-02-22 | Discharge: 2023-02-22 | Disposition: A | Discharge status: Home / Self Care | Payer: Medicare PPO | Attending: Emergency Medicine | Admitting: Emergency Medicine

## 2023-02-22 DIAGNOSIS — R0789 Other chest pain: Secondary | ICD-10-CM

## 2023-02-22 DIAGNOSIS — E119 Type 2 diabetes mellitus without complications: Secondary | ICD-10-CM | POA: Diagnosis not present

## 2023-02-22 DIAGNOSIS — Z794 Long term (current) use of insulin: Secondary | ICD-10-CM | POA: Diagnosis not present

## 2023-02-22 DIAGNOSIS — I10 Essential (primary) hypertension: Secondary | ICD-10-CM | POA: Diagnosis not present

## 2023-02-22 DIAGNOSIS — Z79899 Other long term (current) drug therapy: Secondary | ICD-10-CM | POA: Diagnosis not present

## 2023-02-22 DIAGNOSIS — R079 Chest pain, unspecified: Secondary | ICD-10-CM | POA: Diagnosis present

## 2023-02-22 HISTORY — DX: Essential (primary) hypertension: I10

## 2023-02-22 LAB — CBC WITH DIFFERENTIAL/PLATELET
Abs Immature Granulocytes: 0.01 10*3/uL (ref 0.00–0.07)
Basophils Absolute: 0 10*3/uL (ref 0.0–0.1)
Basophils Relative: 1 %
Eosinophils Absolute: 0.1 10*3/uL (ref 0.0–0.5)
Eosinophils Relative: 1 %
HCT: 39.6 % (ref 36.0–46.0)
Hemoglobin: 12.7 g/dL (ref 12.0–15.0)
Immature Granulocytes: 0 %
Lymphocytes Relative: 16 %
Lymphs Abs: 0.8 10*3/uL (ref 0.7–4.0)
MCH: 28.2 pg (ref 26.0–34.0)
MCHC: 32.1 g/dL (ref 30.0–36.0)
MCV: 87.8 fL (ref 80.0–100.0)
Monocytes Absolute: 0.7 10*3/uL (ref 0.1–1.0)
Monocytes Relative: 14 %
Neutro Abs: 3.5 10*3/uL (ref 1.7–7.7)
Neutrophils Relative %: 68 %
Platelets: 183 10*3/uL (ref 150–400)
RBC: 4.51 MIL/uL (ref 3.87–5.11)
RDW: 13.4 % (ref 11.5–15.5)
WBC: 5.1 10*3/uL (ref 4.0–10.5)
nRBC: 0 % (ref 0.0–0.2)

## 2023-02-22 LAB — COMPREHENSIVE METABOLIC PANEL
ALT: 35 U/L (ref 0–44)
AST: 43 U/L — ABNORMAL HIGH (ref 15–41)
Albumin: 3.6 g/dL (ref 3.5–5.0)
Alkaline Phosphatase: 58 U/L (ref 38–126)
Anion gap: 9 (ref 5–15)
BUN: 14 mg/dL (ref 8–23)
CO2: 24 mmol/L (ref 22–32)
Calcium: 8.8 mg/dL — ABNORMAL LOW (ref 8.9–10.3)
Chloride: 101 mmol/L (ref 98–111)
Creatinine, Ser: 0.85 mg/dL (ref 0.44–1.00)
GFR, Estimated: >60 mL/min (ref >60)
Glucose, Bld: 131 mg/dL — ABNORMAL HIGH (ref 70–99)
Potassium: 3.6 mmol/L (ref 3.5–5.1)
Sodium: 134 mmol/L — ABNORMAL LOW (ref 135–145)
Total Bilirubin: 0.7 mg/dL (ref 0.3–1.2)
Total Protein: 6.4 g/dL — ABNORMAL LOW (ref 6.5–8.1)

## 2023-02-22 LAB — D-DIMER, QUANTITATIVE: D-Dimer, Quant: 5.32 ug/mL-FEU — ABNORMAL HIGH (ref 0.00–0.50)

## 2023-02-22 LAB — TROPONIN I (HIGH SENSITIVITY)
Troponin I (High Sensitivity): 6 ng/L (ref <18)
Troponin I (High Sensitivity): 7 ng/L (ref <18)

## 2023-02-22 LAB — BRAIN NATRIURETIC PEPTIDE: B Natriuretic Peptide: 44.8 pg/mL (ref 0.0–100.0)

## 2023-02-22 LAB — LIPASE, BLOOD: Lipase: 27 U/L (ref 11–51)

## 2023-02-22 MED ORDER — LIDOCAINE 5 % EX PTCH: 1.0000 | MEDICATED_PATCH | CUTANEOUS | 0 refills | Status: DC

## 2023-02-22 MED ORDER — IOHEXOL 350 MG/ML SOLN
100.0000 mL | Freq: Once | INTRAVENOUS | Status: AC | PRN
  Administered 2023-02-22: 100 mL via INTRAVENOUS

## 2023-02-22 MED ORDER — ACETAMINOPHEN 500 MG PO TABS: 500.0000 mg | ORAL_TABLET | Freq: Four times a day (QID) | ORAL | 0 refills | Status: DC | PRN

## 2023-02-22 NOTE — ED Provider Notes (Signed)
Patient signed out to me at 2100 by Dr. Manus Gunning or pending repeat troponin CT read.  In short this is a 89 year old female with a past medical history of hypertension and diabetes that presented to the emergency department with left-sided chest and back pain that started last night.  Patient's initial troponin was negative without acute changes on EKG.  D-dimer was elevated to 5.3.  Due to her hypertension and radiation of pain to the back she had a dissection study to evaluate for dissection or PE.  Patient CT scan showed no acute abnormality and repeat troponin was normal.  On my evaluation, the patient's pain has essentially resolved.  Heart is regular rate and rhythm and lungs are clear to auscultation bilaterally.  She is stable for discharge home with outpatient follow-up and was given strict return precautions.   Rexford Maus, DO 02/22/23 2223

## 2023-02-22 NOTE — Discharge Instructions (Addendum)
There is no evidence of heart attack or blood clot in the lung.  Your chest pain may be due to irritation of your chest wall.  You should follow-up with a cardiologist for a stress test. You should take your home Tylenol and can use Lidocaine patches for pain. Return to the ED sooner with exertional chest pain, pain associate with shortness of breath, nausea, vomiting, sweating or any other concerns.

## 2023-02-22 NOTE — ED Triage Notes (Signed)
Left sided chest pain radiating to shoulder and back since last night. EKG SR per EMS. Total of 324mg  Aspirin taken. No other symptoms. Alert and oriented x 4. Denies hx.   EMS VS:  BP 150/90 HR 77 O2 100% RA.

## 2023-02-22 NOTE — ED Provider Notes (Signed)
[place] EMERGENCY DEPARTMENT AT [facility] Provider Note   CSN: [id number] Arrival date & time: 02/22/23  1828     History  Chief Complaint  Patient presents with   Chest Pain    Karen [name] is a 89 y.o. female.  Patient with a history of diabetes, hypertension, chronic back pain presenting by EMS with left-sided chest, shoulder and upper back pain that onset last evening.  Pain has gotten progressively worse throughout the night and day.  Reports sleeping poorly with constant pain gradually getting worse.  Worse with movement.  Not tried anything for it at home.  Did move a table yesterday but does not believe she injured herself doing that.  Denies any shortness of breath, nausea, vomiting, cough, fever, sweating.  She is never had this kind of pain previously.  EMS gave aspirin with partial relief.  Denies any weakness, numbness or tingling.  No abdominal pain or low back pain.  Denies any cardiac history. Pain is worse with palpation and movement of the left arm.  The history is provided by the patient and the EMS personnel.  Chest Pain Associated symptoms: back pain   Associated symptoms: no abdominal pain, no dizziness, no fever, no headache, no nausea, no shortness of breath, no vomiting and no weakness        Home Medications Prior to Admission medications   Medication Sig Start Date End Date Taking? Authorizing Provider  Accu-Chek Softclix Lancets lancets Use to test blood sugar 4 times a day (Dx:  E11.65) 10/08/19   [provider]  acetaminophen (TYLENOL) 500 MG tablet Take 2 tablets 3 times a day as directed as needed    [provider]  Alcohol Swabs (ALCOHOL WIPES) 70 % PADS Use as directed for insulin injections and to test blood sugar 4 times a day (Dx:  E11.65) 10/08/19   [provider]  amoxicillin (AMOXIL) 500 MG capsule TAKE 1 CAPSULE BY MOUTH THREE TIMES A DAY UNTIL GONE 07/07/18   [provider]  Ascorbic Acid  (VITAMIN C) 100 MG tablet Take 100 mg by mouth daily.    [provider]  aspirin 81 MG tablet Take 81 mg by mouth daily.    [provider]  B-D UF III MINI PEN NEEDLES 31G X 5 MM MISC  06/16/20   [provider]  Blood Glucose Calibration (ACCU-CHEK AVIVA) SOLN Use as directed 10/11/19   [provider]  Blood Glucose Monitoring Suppl (GLUCOCOM BLOOD GLUCOSE MONITOR) DEVI Use to test blood sugar as directed (Dx:  E11.65) 10/08/19   [provider]  Calcium Carb-Cholecalciferol (CALCIUM-VITAMIN D) 500-200 MG-UNIT tablet Take 2 tablets once a day    [provider]  calcium-vitamin D (OSCAL WITH D) 500-200 MG-UNIT TABS tablet Take 1 tablet once a day    [provider]  Cholecalciferol 25 MCG (1000 UT) tablet Take 1 tablet by mouth daily.    [provider]  Cod Liver Oil CAPS Take 1 capsule once a day    [provider]  diclofenac (VOLTAREN) 50 MG EC tablet TAKE 1 TABLET BY MOUTH 3 TIMES A DAY WITH MEAL OR SNACK AS NEEDED FOR INFLAMMATION/PAIN 04/06/18   Kerrin Champagne, MD  Garlic 100 MG TABS Take 1 tablet once a day    [provider]  glucose blood (ACCU-CHEK AVIVA PLUS) test strip Use to test blood sugar 4 times a day (Dx:  E11.65) 10/08/19   [provider]  HUMALOG MIX 75/25 KWIKPEN (75-25) 100 UNIT/ML Kwikpen  10/01/18   [provider]  insulin aspart protamine - aspart (NOVOLOG MIX 70/30 FLEXPEN) (70-30) 100 UNIT/ML FlexPen Inject 9 units before breakfast - 6 units before supper for diabetes 10/04/19   [provider]  lidocaine (XYLOCAINE) 5 % ointment Apply 1 Application topically 3 (three) times daily as needed. 12/30/22   Raspet, Noberto Retort, PA-C  Multiple Vitamins-Minerals (MULTI COMPLETE PO) Take 1 tablet by mouth daily.     [provider]  quinapril (ACCUPRIL) 40 MG tablet Take 1/2 tablet once a day for blood pressure 11/29/19   [provider]  rosuvastatin (CRESTOR)  5 MG tablet Take 5 mg by mouth daily. 04/12/19   [provider]  traMADol (ULTRAM) 50 MG tablet TAKE 1 TABLET EVERY 6 HOURS AS NEEDED FOR UP TO 10 DAYS FOR MODERATE PAIN 04/20/19   Kerrin Champagne, MD  triamcinolone ointment (KENALOG) 0.5 % 1 APPLICATION TWICE A DAY EXTERNALLY 14 DAYS 03/16/19   [provider]  UNABLE TO FIND Take 2 tablets once a day    [provider]  UNABLE TO FIND Take 1 tablet once a day    [provider]  UNABLE TO FIND Use to test blood sugars 3 times a day (Dx: E11.9)    [provider]      Allergies    Etodolac, Hydrochlorothiazide, Naproxen, Other, and Codeine    Review of Systems   Review of Systems  Constitutional:  Negative for activity change, appetite change and fever.  HENT:  Negative for congestion and rhinorrhea.   Respiratory:  Positive for chest tightness. Negative for shortness of breath.   Cardiovascular:  Positive for chest pain.  Gastrointestinal:  Negative for abdominal pain, nausea and vomiting.  Genitourinary:  Negative for dysuria and hematuria.  Musculoskeletal:  Positive for back pain. Negative for arthralgias and myalgias.  Skin:  Negative for rash.  Neurological:  Negative for dizziness, weakness and headaches.   all other systems are negative except as noted in the HPI and PMH.    Physical Exam Updated Vital Signs BP (!) 194/83   Pulse 86   Temp 97.8 F (36.6 C) (Oral)   Resp 12   Ht 5\' 1"  (1.549 m)   Wt 74 kg   SpO2 100%   BMI 30.83 kg/m  Physical Exam Vitals and nursing note reviewed.  Constitutional:      General: She is not in acute distress.    Appearance: She is well-developed.  HENT:     Head: Normocephalic and atraumatic.     Mouth/Throat:     Pharynx: No oropharyngeal exudate.  Eyes:     Conjunctiva/sclera: Conjunctivae normal.     Pupils: Pupils are equal, round, and reactive to light.  Neck:     Comments: No meningismus. Cardiovascular:     Rate and Rhythm:  Normal rate and regular rhythm.     Heart sounds: Normal heart sounds. No murmur heard. Pulmonary:     Effort: Pulmonary effort is normal. No respiratory distress.     Breath sounds: Normal breath sounds.     Comments: Left chest wall tenderness, worse with left arm movement Chest:     Chest wall: Tenderness present.  Abdominal:     Palpations: Abdomen is soft.     Tenderness: There is no abdominal tenderness. There is no guarding or rebound.  Musculoskeletal:        General: No tenderness. Normal range of  motion.     Cervical back: Normal range of motion and neck supple.     Comments: Intact radial pulse and cardinal hand movements  Skin:    General: Skin is warm.  Neurological:     Mental Status: She is alert and oriented to person, place, and time.     Cranial Nerves: No cranial nerve deficit.     Motor: No abnormal muscle tone.     Coordination: Coordination normal.     Comments:  5/5 strength throughout. CN 2-12 intact.Equal grip strength.   Psychiatric:        Behavior: Behavior normal.     ED Results / Procedures / Treatments   Labs (all labs ordered are listed, but only abnormal results are displayed) Labs Reviewed  COMPREHENSIVE METABOLIC PANEL - Abnormal; Notable for the following components:      Result Value   Sodium 134 (*)    Glucose, Bld 131 (*)    Calcium 8.8 (*)    Total Protein 6.4 (*)    AST 43 (*)    All other components within normal limits  D-DIMER, QUANTITATIVE - Abnormal; Notable for the following components:   D-Dimer, Quant 5.32 (*)    All other components within normal limits  CBC WITH DIFFERENTIAL/PLATELET  LIPASE, BLOOD  BRAIN NATRIURETIC PEPTIDE  TROPONIN I (HIGH SENSITIVITY)  TROPONIN I (HIGH SENSITIVITY)    EKG EKG Interpretation  Date/Time:  Saturday Feb 22 2023 18:38:15 EDT Ventricular Rate:  84 PR Interval:  141 QRS Duration: 93 QT Interval:  373 QTC Calculation: 441 R Axis:   53 Text Interpretation: Sinus rhythm Consider  right atrial enlargement Abnormal R-wave progression, early transition No significant change was found Confirmed by Glynn Octave [id number]) on 02/22/2023 6:48:03 PM  Radiology DG Chest 2 View  Result Date: 02/22/2023 CLINICAL DATA:  Chest pain. EXAM: CHEST - 2 VIEW COMPARISON:  October 31, 2022 FINDINGS: The heart size and mediastinal contours are within normal limits. Both lungs are clear. The visualized skeletal structures are unremarkable. Tortuosity of the aorta. IMPRESSION: No active cardiopulmonary disease. Electronically Signed   By: Ted Mcalpine M.D.   On: 02/22/2023 19:41    Procedures Procedures    Medications Ordered in ED Medications - No data to display  ED Course/ Medical Decision Making/ A&P                             Medical Decision Making Amount and/or Complexity of Data Reviewed Independent Historian: EMS Labs: ordered. Decision-making details documented in ED Course. Radiology: ordered and independent interpretation performed. Decision-making details documented in ED Course. ECG/medicine tests: ordered and independent interpretation performed. Decision-making details documented in ED Course.   Ongoing left-sided chest pain for greater than 24 hours.  Worse with palpation and movement.  EKG is sinus rhythm without acute ST changes.  Intact radial pulses and grip strength bilaterally.  Suspect likely musculoskeletal pain.  May be due to moving table recently.  Will check chest x-ray and serial troponins.  Patient's troponin is negative.  Chest x-ray is negative for pneumothorax or pneumonia but does show tortuous aorta.  D-dimer is positive.  Will obtain serial troponins as well as CT angiogram to rule out aortic dissection as well as pulmonary embolism.  She is not hypoxic or tachycardic.  Care to be transferred at shift change pending second troponin and CT scan.  Anticipate discharge home with outpatient cardiology follow-up for stress  test.  Final Clinical Impression(s) / ED Diagnoses Final diagnoses:  Atypical chest pain    Rx / DC Orders ED Discharge Orders     None         Rayfield Beem, Jeannett Senior, MD 02/22/23 2048

## 2023-04-30 ENCOUNTER — Ambulatory Visit: Payer: Medicare PPO | Admitting: Cardiology

## 2023-04-30 DIAGNOSIS — R072 Precordial pain: Secondary | ICD-10-CM | POA: Insufficient documentation

## 2023-04-30 NOTE — Progress Notes (Unsigned)
Cardiology Office Note   Date:  05/01/2023   ID:  [name], [name] 1929-10-31, MRN [id number]  PCP:  Felicity Coyer, MD  Cardiologist:   None Referring:  Felicity Coyer, MD  Chief Complaint  Patient presents with   Chest Pain      History of Present Illness: Karen [name] is a 89 y.o. female who presents for evaluation of chest pain.  He was in the emergency room for this in May.  She was getting a walker delivered to her house and there was a lot of stress.  She was having some sharp pain.  There was some discomfort on the left arm.  Never had this before.  I looked at her records from that visit and her D-dimer was mildly elevated.  However, there was no pulmonary embolism on CT.  There was a mention of some mild aortic atherosclerosis but it was actually no mention of coronary calcium.  She previously had a fall and ER visit in 2017.   Echo demonstrated mild LVH in 2017.  She has not had any other cardiovascular workup.  She lives alone.  She vacuums.  She does her chores. The patient denies any new symptoms such as chest discomfort, neck or arm discomfort. There has been no new shortness of breath, PND or orthopnea. There have been no reported palpitations, presyncope or syncope.  The symptoms that she had today are gone and have not been recurrent.   Past Medical History:  Diagnosis Date   Back pain    Diabetes mellitus without complication (HCC)    Hypertension     Past Surgical History:  Procedure Laterality Date   ABDOMINAL HYSTERECTOMY     COLON SURGERY  2005   blockage     Current Outpatient Medications  Medication Sig Dispense Refill   Alcohol Swabs (ALCOHOL WIPES) 70 % PADS Use as directed for insulin injections and to test blood sugar 4 times a day (Dx:  E11.65)     Ascorbic Acid (VITAMIN C) 100 MG tablet Take 100 mg by mouth daily.     aspirin 81 MG tablet Take 81 mg by mouth daily.     B-D UF III MINI PEN NEEDLES 31G X 5 MM MISC      Blood Glucose  Calibration (ACCU-CHEK AVIVA) SOLN Use as directed     Blood Glucose Monitoring Suppl (GLUCOCOM BLOOD GLUCOSE MONITOR) DEVI Use to test blood sugar as directed (Dx:  E11.65)     Calcium Carb-Cholecalciferol (CALCIUM-VITAMIN D) 500-200 MG-UNIT tablet Take 2 tablets once a day     Cholecalciferol 25 MCG (1000 UT) tablet Take 1 tablet by mouth daily.     glucose blood (ACCU-CHEK AVIVA PLUS) test strip Use to test blood sugar 4 times a day (Dx:  E11.65)     HUMALOG MIX 75/25 KWIKPEN (75-25) 100 UNIT/ML Kwikpen      insulin aspart protamine - aspart (NOVOLOG MIX 70/30 FLEXPEN) (70-30) 100 UNIT/ML FlexPen Inject 9 units before breakfast - 6 units before supper for diabetes     lisinopril (ZESTRIL) 20 MG tablet Take 20 mg by mouth daily.     Multiple Vitamins-Minerals (MULTI COMPLETE PO) Take 1 tablet by mouth daily.      quinapril (ACCUPRIL) 40 MG tablet Take 1/2 tablet once a day for blood pressure     rosuvastatin (CRESTOR) 5 MG tablet Take 5 mg by mouth daily.     No current facility-administered medications for this visit.  Allergies:   Etodolac, Hydrochlorothiazide, Naproxen, Other, and Codeine    Social History:  The patient  reports that she has never smoked. She has never used smokeless tobacco. She reports that she does not drink alcohol and does not use drugs.   Family History:  The patient's family history includes Diabetes in her mother.    ROS:  Please see the history of present illness.   Otherwise, review of systems are positive for none.   All other systems are reviewed and negative.    PHYSICAL EXAM: VS:  BP 132/76 (BP Location: Right Arm, Patient Position: Sitting, Cuff Size: Normal)   Pulse 72   Ht 4' (1.219 m)   Wt 151 lb (68.5 kg)   SpO2 98%   BMI 46.08 kg/m  , BMI Body mass index is 46.08 kg/m. GENERAL:  Well appearing HEENT:  Pupils equal round and reactive, fundi not visualized, oral mucosa unremarkable NECK:  No jugular venous distention, waveform within  normal limits, carotid upstroke brisk and symmetric, no bruits, no thyromegaly LYMPHATICS:  No cervical, inguinal adenopathy LUNGS:  Clear to auscultation bilaterally BACK:  No CVA tenderness CHEST:  Unremarkable HEART:  PMI not displaced or sustained,S1 and S2 within normal limits, no S3, no S4, no clicks, no rubs, no murmurs ABD:  Flat, positive bowel sounds normal in frequency in pitch, no bruits, no rebound, no guarding, no midline pulsatile mass, no hepatomegaly, no splenomegaly EXT:  2 plus pulses throughout, mild right leg edema,  no cyanosis no clubbing SKIN:  No rashes no nodules NEURO:  Cranial nerves II through XII grossly intact, motor grossly intact throughout PSYCH:  Cognitively intact, oriented to person place and time    EKG: 02/22/2023 sinus rhythm, rate 84, axis within normal limits, intervals within normal limits, no acute ST-T wave changes.   Recent Labs: 02/22/2023: ALT 35; B Natriuretic Peptide 44.8; BUN 14; Creatinine, Ser 0.85; Hemoglobin 12.7; Platelets 183; Potassium 3.6; Sodium 134    Lipid Panel No results found for: "CHOL", "TRIG", "HDL", "CHOLHDL", "VLDL", "LDLCALC", "LDLDIRECT"    Wt Readings from Last 3 Encounters:  05/01/23 151 lb (68.5 kg)  02/22/23 163 lb 2.3 oz (74 kg)  09/03/18 165 lb (74.8 kg)      Other studies Reviewed: Additional studies/ records that were reviewed today include: ED records. Review of the above records demonstrates:  Please see elsewhere in the note.     ASSESSMENT AND PLAN:  Precordial chest pain:   The patient's chest discomfort is not anginal.  She had no objective evidence of ischemia in the emergency room.  She has no ongoing symptoms.  I do not think further cardiovascular testing is suggested at this point.  She would let me know if she has recurrent symptoms.  Diabetes mellitus: A1c was most recently 6.7 but she is followed by endocrinology.  I will defer to their management  Hypertension: Blood pressure  controlled.  Continue the meds as listed.   Current medicines are reviewed at length with the patient today.  The patient does not have concerns regarding medicines.  The following changes have been made:  no change  Labs/ tests ordered today include: None No orders of the defined types were placed in this encounter.    Disposition:   FU with me as needed.     Signed, Rollene Rotunda, MD  05/01/2023 11:13 AM    [facility]

## 2023-05-01 ENCOUNTER — Encounter: Payer: Self-pay | Admitting: Cardiology

## 2023-05-01 ENCOUNTER — Ambulatory Visit: Payer: Medicare PPO | Attending: Cardiology | Admitting: Cardiology

## 2023-05-01 VITALS — BP 132/76 | HR 72 | Ht <= 58 in | Wt 151.0 lb

## 2023-05-01 DIAGNOSIS — R072 Precordial pain: Secondary | ICD-10-CM | POA: Diagnosis not present

## 2023-05-01 NOTE — Patient Instructions (Signed)
    Follow-Up: At [facility], you and your health needs are our priority.  As part of our continuing mission to provide you with exceptional heart care, we have created designated Provider Care Teams.  These Care Teams include your primary Cardiologist (physician) and Advanced Practice Providers (APPs -  Physician Assistants and Nurse Practitioners) who all work together to provide you with the care you need, when you need it.  We recommend signing up for the patient portal called "MyChart".  Sign up information is provided on this After Visit Summary.  MyChart is used to connect with patients for Virtual Visits (Telemedicine).  Patients are able to view lab/test results, encounter notes, upcoming appointments, etc.  Non-urgent messages can be sent to your provider as well.   To learn more about what you can do with MyChart, go to [email or web address].    Your next appointment:   As needed        

## 2023-05-19 ENCOUNTER — Ambulatory Visit: Payer: Medicare PPO | Admitting: Podiatry

## 2023-05-19 ENCOUNTER — Encounter: Payer: Self-pay | Admitting: Podiatry

## 2023-05-19 DIAGNOSIS — M79675 Pain in left toe(s): Secondary | ICD-10-CM | POA: Diagnosis not present

## 2023-05-19 DIAGNOSIS — B351 Tinea unguium: Secondary | ICD-10-CM

## 2023-05-19 DIAGNOSIS — M79674 Pain in right toe(s): Secondary | ICD-10-CM | POA: Diagnosis not present

## 2023-05-19 DIAGNOSIS — E119 Type 2 diabetes mellitus without complications: Secondary | ICD-10-CM

## 2023-05-19 DIAGNOSIS — I739 Peripheral vascular disease, unspecified: Secondary | ICD-10-CM

## 2023-05-19 NOTE — Progress Notes (Signed)
This patient returns to my office for at risk foot care.  This patient requires this care by a professional since this patient will be at risk due to having type 2 diabetes.  This patient is unable to cut nails herself since the patient cannot reach her nails.These nails are painful walking and wearing shoes.  This patient presents for at risk foot care today.  General Appearance  Alert, conversant and in no acute stress.  Vascular  Dorsalis pedis and posterior tibial  pulses are weakly  palpable  bilaterally.  Capillary return is within normal limits  bilaterally. Cold feet  Bilaterally. Absent digital hair  B/L.  Neurologic  Senn-Weinstein monofilament wire test within normal limits  bilaterally. Muscle power within normal limits bilaterally.  Nails Thick disfigured discolored nails with subungual debris  from hallux to fifth toes bilaterally. No evidence of bacterial infection or drainage bilaterally.  Orthopedic  No limitations of motion  feet .  No crepitus or effusions noted.  No bony pathology .  Hammer toes second  B/L.  Skin  normotropic skin with no porokeratosis noted bilaterally.  No signs of infections or ulcers noted.     Onychomycosis  Pain in right toes  Pain in left toes  Consent was obtained for treatment procedures.   Mechanical debridement of nails 1-5  bilaterally performed with a nail nipper.  Filed with dremel without incident.     Return office visit  4 months                   Told patient to return for periodic foot care and evaluation due to potential at risk complications.   Helane Gunther DPM

## 2023-08-04 ENCOUNTER — Other Ambulatory Visit: Payer: Self-pay | Admitting: Family Medicine

## 2023-08-04 DIAGNOSIS — Z1231 Encounter for screening mammogram for malignant neoplasm of breast: Secondary | ICD-10-CM

## 2023-09-10 ENCOUNTER — Ambulatory Visit
Admission: RE | Admit: 2023-09-10 | Discharge: 2023-09-10 | Disposition: A | Discharge status: Home / Self Care | Payer: Medicare PPO | Source: Ambulatory Visit | Attending: Family Medicine | Admitting: Family Medicine

## 2023-09-10 DIAGNOSIS — Z1231 Encounter for screening mammogram for malignant neoplasm of breast: Secondary | ICD-10-CM

## 2023-09-18 ENCOUNTER — Ambulatory Visit: Payer: Medicare PPO | Admitting: Podiatry

## 2023-11-18 ENCOUNTER — Encounter: Payer: Self-pay | Admitting: Podiatry

## 2023-11-18 ENCOUNTER — Ambulatory Visit: Payer: Medicare PPO | Admitting: Podiatry

## 2023-11-18 DIAGNOSIS — E119 Type 2 diabetes mellitus without complications: Secondary | ICD-10-CM

## 2023-11-18 DIAGNOSIS — M79674 Pain in right toe(s): Secondary | ICD-10-CM | POA: Diagnosis not present

## 2023-11-18 DIAGNOSIS — B351 Tinea unguium: Secondary | ICD-10-CM

## 2023-11-18 DIAGNOSIS — I739 Peripheral vascular disease, unspecified: Secondary | ICD-10-CM | POA: Diagnosis not present

## 2023-11-18 DIAGNOSIS — M79675 Pain in left toe(s): Secondary | ICD-10-CM | POA: Diagnosis not present

## 2023-11-18 NOTE — Progress Notes (Signed)
This patient returns to my office for at risk foot care.  This patient requires this care by a professional since this patient will be at risk due to having type 2 diabetes.  This patient is unable to cut nails herself since the patient cannot reach her nails.These nails are painful walking and wearing shoes.  This patient presents for at risk foot care today.  General Appearance  Alert, conversant and in no acute stress.  Vascular  Dorsalis pedis and posterior tibial  pulses are weakly  palpable  bilaterally.  Capillary return is within normal limits  bilaterally. Cold feet  Bilaterally. Absent digital hair  B/L.  Neurologic  Senn-Weinstein monofilament wire test within normal limits  bilaterally. Muscle power within normal limits bilaterally.  Nails Thick disfigured discolored nails with subungual debris  from hallux to fifth toes bilaterally. No evidence of bacterial infection or drainage bilaterally.  Orthopedic  No limitations of motion  feet .  No crepitus or effusions noted.  No bony pathology .  Hammer toes second  B/L.  Skin  normotropic skin with no porokeratosis noted bilaterally.  No signs of infections or ulcers noted.     Onychomycosis  Pain in right toes  Pain in left toes  Consent was obtained for treatment procedures.   Mechanical debridement of nails 1-5  bilaterally performed with a nail nipper.  Filed with dremel without incident.     Return office visit  4 months                   Told patient to return for periodic foot care and evaluation due to potential at risk complications.   Helane Gunther DPM

## 2023-12-12 ENCOUNTER — Ambulatory Visit: Payer: Medicare PPO | Admitting: Podiatry

## 2023-12-12 DIAGNOSIS — M7751 Other enthesopathy of right foot: Secondary | ICD-10-CM

## 2023-12-12 DIAGNOSIS — M7752 Other enthesopathy of left foot: Secondary | ICD-10-CM | POA: Diagnosis not present

## 2023-12-12 NOTE — Progress Notes (Signed)
 Subjective:  Patient ID: Karen [name], female    DOB: [birth year],  MRN: [id number]  Chief Complaint  Patient presents with   Foot Pain    Bilateral foot pain in the ball of her foot     89 y.o. female presents with the above complaint.  Patient presents with bilateral second metatarsophalangeal joint pain hurts with ambulation worse with pressure she is known to Dr. Stacie Acres for routine footcare she is getting here to evaluate the pain in the ball of her feet pain scale 7 out of 10 dull aching nature only hurts once in a while occasionally when she is ambulating.  Denies any other acute issues.  She is a diabetic.   Review of Systems: Negative except as noted in the HPI. Denies N/V/F/Ch.  Past Medical History:  Diagnosis Date   Back pain    Diabetes mellitus without complication (HCC)    Hypertension     Current Outpatient Medications:    Alcohol Swabs (ALCOHOL WIPES) 70 % PADS, Use as directed for insulin injections and to test blood sugar 4 times a day (Dx:  E11.65), Disp: , Rfl:    Ascorbic Acid (VITAMIN C) 100 MG tablet, Take 100 mg by mouth daily., Disp: , Rfl:    aspirin 81 MG tablet, Take 81 mg by mouth daily., Disp: , Rfl:    B-D UF III MINI PEN NEEDLES 31G X 5 MM MISC, , Disp: , Rfl:    Blood Glucose Calibration (ACCU-CHEK AVIVA) SOLN, Use as directed, Disp: , Rfl:    Blood Glucose Monitoring Suppl (GLUCOCOM BLOOD GLUCOSE MONITOR) DEVI, Use to test blood sugar as directed (Dx:  E11.65), Disp: , Rfl:    Calcium Carb-Cholecalciferol (CALCIUM-VITAMIN D) 500-200 MG-UNIT tablet, Take 2 tablets once a day, Disp: , Rfl:    Cholecalciferol 25 MCG (1000 UT) tablet, Take 1 tablet by mouth daily., Disp: , Rfl:    glucose blood (ACCU-CHEK AVIVA PLUS) test strip, Use to test blood sugar 4 times a day (Dx:  E11.65), Disp: , Rfl:    HUMALOG MIX 75/25 KWIKPEN (75-25) 100 UNIT/ML Kwikpen, , Disp: , Rfl:    insulin aspart protamine - aspart (NOVOLOG MIX 70/30 FLEXPEN) (70-30) 100 UNIT/ML  FlexPen, Inject 9 units before breakfast - 6 units before supper for diabetes, Disp: , Rfl:    lisinopril (ZESTRIL) 20 MG tablet, Take 20 mg by mouth daily., Disp: , Rfl:    Multiple Vitamins-Minerals (MULTI COMPLETE PO), Take 1 tablet by mouth daily. , Disp: , Rfl:    quinapril (ACCUPRIL) 40 MG tablet, Take 1/2 tablet once a day for blood pressure, Disp: , Rfl:    rosuvastatin (CRESTOR) 5 MG tablet, Take 5 mg by mouth daily., Disp: , Rfl:   Social History   Tobacco Use  Smoking Status Never  Smokeless Tobacco Never    Allergies  Allergen Reactions   Etodolac     Other reaction(s): elevated liver enzymes   Hydrochlorothiazide Other (See Comments)    Unknown   Naproxen     Other reaction(s): gastroesophagitis   Other Other (See Comments)    Unknown Unknown Unknown   Codeine Rash    Unknown to patient  Other reaction(s): Unknown   Objective:  There were no vitals filed for this visit. There is no height or weight on file to calculate BMI. Constitutional Well developed. Well nourished.  Vascular Dorsalis pedis pulses palpable bilaterally. Posterior tibial pulses palpable bilaterally. Capillary refill normal to all digits.  No cyanosis or  clubbing noted. Pedal hair growth normal.  Neurologic Normal speech. Oriented to person, place, and time. Epicritic sensation to light touch grossly present bilaterally.  Dermatologic Nails well groomed and normal in appearance. No open wounds. No skin lesions.  Orthopedic: Bilateral second metatarsophalangeal joint pain pain with range of motion of the joint.  No pain at the third fourth fifth metatarsophalangeal joint.  Negative Mulder's click noted no signs of neuroma noted.  Negative extensor flexor tendinitis noted   Radiographs: None Assessment:   1. Capsulitis of metatarsophalangeal (MTP) joint of right foot   2. Capsulitis of metatarsophalangeal (MTP) joint of left foot    Plan:  Patient was evaluated and treated and all  questions answered.  Bilateral second metatarsophalangeal joint capsulitis -All questions and concerns were discussed with the patient extensive detail given the amount of pain that she is having she will benefit from steroid injection have decreased inflammatory component associate with pain.  Patient agrees with plan to proceed with steroid injection. A steroid injection was performed at bilateral second metatarsophalangeal joint using 1% plain Lidocaine and 10 mg of Kenalog. This was well tolerated.   No follow-ups on file.

## 2024-02-16 ENCOUNTER — Ambulatory Visit: Payer: Medicare PPO | Admitting: Podiatry

## 2024-02-16 ENCOUNTER — Encounter: Payer: Self-pay | Admitting: Podiatry

## 2024-02-16 DIAGNOSIS — M79675 Pain in left toe(s): Secondary | ICD-10-CM

## 2024-02-16 DIAGNOSIS — M79674 Pain in right toe(s): Secondary | ICD-10-CM

## 2024-02-16 DIAGNOSIS — I739 Peripheral vascular disease, unspecified: Secondary | ICD-10-CM

## 2024-02-16 DIAGNOSIS — B351 Tinea unguium: Secondary | ICD-10-CM | POA: Diagnosis not present

## 2024-02-16 NOTE — Progress Notes (Signed)
This patient returns to my office for at risk foot care.  This patient requires this care by a professional since this patient will be at risk due to having type 2 diabetes.  This patient is unable to cut nails herself since the patient cannot reach her nails.These nails are painful walking and wearing shoes.  This patient presents for at risk foot care today.  General Appearance  Alert, conversant and in no acute stress.  Vascular  Dorsalis pedis and posterior tibial  pulses are weakly  palpable  bilaterally.  Capillary return is within normal limits  bilaterally. Cold feet  Bilaterally. Absent digital hair  B/L.  Neurologic  Senn-Weinstein monofilament wire test within normal limits  bilaterally. Muscle power within normal limits bilaterally.  Nails Thick disfigured discolored nails with subungual debris  from hallux to fifth toes bilaterally. No evidence of bacterial infection or drainage bilaterally.  Orthopedic  No limitations of motion  feet .  No crepitus or effusions noted.  No bony pathology .  Hammer toes second  B/L.  Skin  normotropic skin with no porokeratosis noted bilaterally.  No signs of infections or ulcers noted.     Onychomycosis  Pain in right toes  Pain in left toes  Consent was obtained for treatment procedures.   Mechanical debridement of nails 1-5  bilaterally performed with a nail nipper.  Filed with dremel without incident.     Return office visit  4 months                   Told patient to return for periodic foot care and evaluation due to potential at risk complications.   Helane Gunther DPM

## 2024-05-18 ENCOUNTER — Other Ambulatory Visit: Payer: Self-pay

## 2024-05-18 ENCOUNTER — Telehealth: Payer: Self-pay | Admitting: Lab

## 2024-05-18 ENCOUNTER — Telehealth: Payer: Self-pay | Admitting: Podiatry

## 2024-05-18 ENCOUNTER — Ambulatory Visit: Admitting: Podiatry

## 2024-05-18 DIAGNOSIS — L03116 Cellulitis of left lower limb: Secondary | ICD-10-CM

## 2024-05-18 MED ORDER — AMOXICILLIN-POT CLAVULANATE 875-125 MG PO TABS: 1.0000 | ORAL_TABLET | Freq: Two times a day (BID) | ORAL | 0 refills | Status: DC

## 2024-05-18 MED ORDER — AMOXICILLIN-POT CLAVULANATE 875-125 MG PO TABS: 1.0000 | ORAL_TABLET | Freq: Two times a day (BID) | ORAL | 0 refills | Status: AC

## 2024-05-18 NOTE — Progress Notes (Signed)
 Patient presents with complaint of swelling and pain around the ankle on the left.  Has noticed redness starting.  States she always has some swelling in the lower legs.  No fever or chills or nausea or vomiting.  Started about 3 days ago.   Physical exam:  General appearance: Pleasant, and in no acute distress. AOx3.  Vascular: Pedal pulses: DP 1/4 bilaterally, PT 1 /4 bilaterally.  Moderate edema lower legs bilaterally with increased edema around the ankle left.. Capillary fill time immediate..  Neurological: Light touch intact feet bilaterally.  Normal Achilles reflex bilaterally.  No clonus or spasticity noted.   Dermatologic:   Red area around the medial ankle and anterior ankle left.  2 small pustules are noted.  Area is warm to touch.  Tender to touch.   Musculoskeletal: No tenderness to palpation around the ankle joint proper left.  No tenderness with range of motion ankle joint left    Diagnosis: 1 cellulitis ankle left.  Plan: -Established office visit for evaluation management level 3. - Discussed with her the infectious cellulitis  ankle left.  Will place her on Augmentin  for 10 days p.o..  Discussed with her elevation of the foot and ankle and moist warm compresses half an hour 3 or 4 times a day. -Rx Augmentin  875mg  1 p.o. twice daily for 10 days  Has scheduled appointment with Dr. Loreda for [facility] in 3 days he can evaluate the cellulitis at that point.SABRA

## 2024-05-18 NOTE — Telephone Encounter (Signed)
 Patient went  to pharmacy to pick up medicine and pharmacy told her they were unable to fill the prescription as at is not correct. Can prescription be corrected and resent? Thanks

## 2024-05-18 NOTE — Telephone Encounter (Signed)
 Patient calling to find out why medication didn't go through pharmacy stated something didn't go through on our end also please contact niece with info on what patient is being treated for is very concerned [phone].

## 2024-05-19 ENCOUNTER — Encounter: Payer: Self-pay | Admitting: Podiatry

## 2024-05-19 ENCOUNTER — Ambulatory Visit: Admitting: Podiatry

## 2024-05-19 DIAGNOSIS — M79675 Pain in left toe(s): Secondary | ICD-10-CM | POA: Diagnosis not present

## 2024-05-19 DIAGNOSIS — M79674 Pain in right toe(s): Secondary | ICD-10-CM | POA: Diagnosis not present

## 2024-05-19 DIAGNOSIS — B351 Tinea unguium: Secondary | ICD-10-CM | POA: Diagnosis not present

## 2024-05-19 DIAGNOSIS — I739 Peripheral vascular disease, unspecified: Secondary | ICD-10-CM | POA: Diagnosis not present

## 2024-05-19 NOTE — Progress Notes (Signed)
This patient returns to my office for at risk foot care.  This patient requires this care by a professional since this patient will be at risk due to having type 2 diabetes.  This patient is unable to cut nails herself since the patient cannot reach her nails.These nails are painful walking and wearing shoes.  This patient presents for at risk foot care today.  General Appearance  Alert, conversant and in no acute stress.  Vascular  Dorsalis pedis and posterior tibial  pulses are weakly  palpable  bilaterally.  Capillary return is within normal limits  bilaterally. Cold feet  Bilaterally. Absent digital hair  B/L.  Neurologic  Senn-Weinstein monofilament wire test within normal limits  bilaterally. Muscle power within normal limits bilaterally.  Nails Thick disfigured discolored nails with subungual debris  from hallux to fifth toes bilaterally. No evidence of bacterial infection or drainage bilaterally.  Orthopedic  No limitations of motion  feet .  No crepitus or effusions noted.  No bony pathology .  Hammer toes second  B/L.  Skin  normotropic skin with no porokeratosis noted bilaterally.  No signs of infections or ulcers noted.     Onychomycosis  Pain in right toes  Pain in left toes  Consent was obtained for treatment procedures.   Mechanical debridement of nails 1-5  bilaterally performed with a nail nipper.  Filed with dremel without incident.     Return office visit  4 months                   Told patient to return for periodic foot care and evaluation due to potential at risk complications.   Helane Gunther DPM

## 2024-05-19 NOTE — Telephone Encounter (Signed)
 Called pharmacy and order is now ready for pick-up. MLP(05/19/2024)

## 2024-07-15 ENCOUNTER — Other Ambulatory Visit: Payer: Self-pay | Admitting: Family Medicine

## 2024-07-15 DIAGNOSIS — I82409 Acute embolism and thrombosis of unspecified deep veins of unspecified lower extremity: Secondary | ICD-10-CM

## 2024-07-21 ENCOUNTER — Ambulatory Visit
Admission: RE | Admit: 2024-07-21 | Discharge: 2024-07-21 | Disposition: A | Discharge status: Home / Self Care | Source: Ambulatory Visit | Attending: Family Medicine | Admitting: Family Medicine

## 2024-07-21 DIAGNOSIS — I82409 Acute embolism and thrombosis of unspecified deep veins of unspecified lower extremity: Secondary | ICD-10-CM

## 2024-08-11 ENCOUNTER — Ambulatory Visit: Admitting: Podiatry

## 2024-08-11 DIAGNOSIS — M19072 Primary osteoarthritis, left ankle and foot: Secondary | ICD-10-CM

## 2024-08-11 DIAGNOSIS — M19071 Primary osteoarthritis, right ankle and foot: Secondary | ICD-10-CM

## 2024-08-11 NOTE — Progress Notes (Signed)
 Subjective:  Patient ID: Karen [name], female    DOB: 06-05-1930,  MRN: [id number]  Chief Complaint  Patient presents with   Foot Pain    Pt would like to get an injection in both her feet     89 y.o. female presents with the above complaint.  Patient presents with bilateral midfoot dorsal midfoot pain hurts with ambulation is with pressure she would like to discuss treatment options for pain scale 7 out of 10 dull aching nature.  She states that she is here to get her injection helps her a lot denies any other acute issues.   Review of Systems: Negative except as noted in the HPI. Denies N/V/F/Ch.  Past Medical History:  Diagnosis Date   Back pain    Diabetes mellitus without complication (HCC)    Hypertension     Current Outpatient Medications:    Alcohol Swabs (ALCOHOL WIPES) 70 % PADS, Use as directed for insulin  injections and to test blood sugar 4 times a day (Dx:  E11.65), Disp: , Rfl:    Ascorbic Acid (VITAMIN C) 100 MG tablet, Take 100 mg by mouth daily., Disp: , Rfl:    aspirin 81 MG tablet, Take 81 mg by mouth daily., Disp: , Rfl:    B-D UF III MINI PEN NEEDLES 31G X 5 MM MISC, , Disp: , Rfl:    Blood Glucose Calibration (ACCU-CHEK AVIVA) SOLN, Use as directed, Disp: , Rfl:    Blood Glucose Monitoring Suppl (GLUCOCOM BLOOD GLUCOSE MONITOR) DEVI, Use to test blood sugar as directed (Dx:  E11.65), Disp: , Rfl:    Calcium Carb-Cholecalciferol (CALCIUM-VITAMIN D) 500-200 MG-UNIT tablet, Take 2 tablets once a day, Disp: , Rfl:    Cholecalciferol 25 MCG (1000 UT) tablet, Take 1 tablet by mouth daily., Disp: , Rfl:    glucose blood (ACCU-CHEK AVIVA PLUS) test strip, Use to test blood sugar 4 times a day (Dx:  E11.65), Disp: , Rfl:    HUMALOG MIX 75/25 KWIKPEN (75-25) 100 UNIT/ML Kwikpen, , Disp: , Rfl:    insulin  aspart protamine - aspart (NOVOLOG  MIX 70/30 FLEXPEN) (70-30) 100 UNIT/ML FlexPen, Inject 9 units before breakfast - 6 units before supper for diabetes, Disp: , Rfl:     lisinopril  (ZESTRIL ) 20 MG tablet, Take 20 mg by mouth daily., Disp: , Rfl:    Multiple Vitamins-Minerals (MULTI COMPLETE PO), Take 1 tablet by mouth daily. , Disp: , Rfl:    quinapril  (ACCUPRIL ) 40 MG tablet, Take 1/2 tablet once a day for blood pressure, Disp: , Rfl:    rosuvastatin (CRESTOR) 5 MG tablet, Take 5 mg by mouth daily., Disp: , Rfl:   Social History   Tobacco Use  Smoking Status Never  Smokeless Tobacco Never    Allergies  Allergen Reactions   Etodolac     Other reaction(s): elevated liver enzymes   Hydrochlorothiazide Other (See Comments)    Unknown   Naproxen     Other reaction(s): gastroesophagitis   Other Other (See Comments)    Unknown Unknown Unknown   Codeine Rash    Unknown to patient  Other reaction(s): Unknown   Objective:  There were no vitals filed for this visit. There is no height or weight on file to calculate BMI. Constitutional Well developed. Well nourished.  Vascular Dorsalis pedis pulses palpable bilaterally. Posterior tibial pulses palpable bilaterally. Capillary refill normal to all digits.  No cyanosis or clubbing noted. Pedal hair growth normal.  Neurologic Normal speech. Oriented to person, place, and  time. Epicritic sensation to light touch grossly present bilaterally.  Dermatologic Nails well groomed and normal in appearance. No open wounds. No skin lesions.  Orthopedic: Bilateral midfoot pain at the dorsal midfoot.  No extensor tendinitis pain.  No Lisfranc injury.  Clinically able to appreciate the edges of the dorsal midfoot arthritis.   Radiographs: None Assessment:   1. 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 Arthritis of right midfoot [M19.071]    Plan:  Patient was evaluated and treated and all questions answered.  Bilateral dorsal midfoot arthritis - All questions or concerns were discussed with the patient extensively today given the amount of pain that she is experiencing she would benefit from a steroid  injection because of inflammatory, surgical pain.  Patient agrees with plan like to proceed with steroid injection -A steroid injection was performed at  bilateral dorsal midfoot using 1% plain Lidocaine  and 10 mg of Kenalog. This was well tolerated.   No follow-ups on file.

## 2024-08-12 ENCOUNTER — Other Ambulatory Visit: Payer: Self-pay | Admitting: Internal Medicine

## 2024-08-12 DIAGNOSIS — Z1231 Encounter for screening mammogram for malignant neoplasm of breast: Secondary | ICD-10-CM

## 2024-08-21 ENCOUNTER — Emergency Department (HOSPITAL_COMMUNITY)
Admission: EM | Admit: 2024-08-21 | Discharge: 2024-08-21 | Disposition: A | Discharge status: Home / Self Care | Attending: Emergency Medicine | Admitting: Emergency Medicine

## 2024-08-21 ENCOUNTER — Emergency Department (HOSPITAL_COMMUNITY)

## 2024-08-21 ENCOUNTER — Encounter (HOSPITAL_COMMUNITY): Payer: Self-pay

## 2024-08-21 ENCOUNTER — Other Ambulatory Visit: Payer: Self-pay

## 2024-08-21 ENCOUNTER — Ambulatory Visit (HOSPITAL_COMMUNITY): Admission: EM | Admit: 2024-08-21 | Discharge: 2024-08-21 | Disposition: A | Discharge status: Home / Self Care

## 2024-08-21 DIAGNOSIS — M25531 Pain in right wrist: Secondary | ICD-10-CM | POA: Diagnosis present

## 2024-08-21 DIAGNOSIS — M79641 Pain in right hand: Secondary | ICD-10-CM | POA: Diagnosis not present

## 2024-08-21 DIAGNOSIS — R296 Repeated falls: Secondary | ICD-10-CM

## 2024-08-21 DIAGNOSIS — S63501A Unspecified sprain of right wrist, initial encounter: Secondary | ICD-10-CM | POA: Insufficient documentation

## 2024-08-21 DIAGNOSIS — S0083XA Contusion of other part of head, initial encounter: Secondary | ICD-10-CM | POA: Diagnosis not present

## 2024-08-21 DIAGNOSIS — S0990XA Unspecified injury of head, initial encounter: Secondary | ICD-10-CM

## 2024-08-21 DIAGNOSIS — Y92009 Unspecified place in unspecified non-institutional (private) residence as the place of occurrence of the external cause: Secondary | ICD-10-CM | POA: Diagnosis not present

## 2024-08-21 DIAGNOSIS — S6991XA Unspecified injury of right wrist, hand and finger(s), initial encounter: Secondary | ICD-10-CM

## 2024-08-21 DIAGNOSIS — Z7982 Long term (current) use of aspirin: Secondary | ICD-10-CM | POA: Insufficient documentation

## 2024-08-21 DIAGNOSIS — W010XXA Fall on same level from slipping, tripping and stumbling without subsequent striking against object, initial encounter: Secondary | ICD-10-CM | POA: Insufficient documentation

## 2024-08-21 DIAGNOSIS — W19XXXA Unspecified fall, initial encounter: Secondary | ICD-10-CM

## 2024-08-21 DIAGNOSIS — S0003XA Contusion of scalp, initial encounter: Secondary | ICD-10-CM

## 2024-08-21 MED ORDER — IBUPROFEN 400 MG PO TABS
400.0000 mg | ORAL_TABLET | Freq: Once | ORAL | Status: AC
  Administered 2024-08-21: 400 mg via ORAL
  Filled 2024-08-21: qty 1

## 2024-08-21 NOTE — ED Notes (Signed)
 Patient is being discharged from the Urgent Care and sent to the Emergency Department via personal opperated vehicle with family . Per [place]  [name] NP, patient is in need of higher level of care due to unwitnessed fall with head injury. Patient is aware and verbalizes understanding of plan of care.  Vitals:   08/21/24 1227  BP: 130/65  Pulse: 74  Resp: 18  Temp: (!) 97.3 F (36.3 C)  SpO2: 97%

## 2024-08-21 NOTE — ED Notes (Signed)
 Patient transported to CT

## 2024-08-21 NOTE — ED Triage Notes (Signed)
 Patient was sent down from uuc for evaluation of her right hand. States she fell yest. Also has a small  knot on the side of her forehead. Patient is  a/o

## 2024-08-21 NOTE — ED Provider Notes (Signed)
 [place] EMERGENCY DEPARTMENT AT [facility] Provider Note   CSN: [id number] Arrival date & time: 08/21/24  1258     Patient presents with: Hand Injury   Karen [name] is a 89 y.o. female who presents to the ED today secondary to concerns of left a fall that happened yesterday and swelling to the dorsum of the right hand.  Also has a small hematoma on the right forehead, fall at home that was unwitnessed, no reported loss of consciousness.  Denies any neck or back pain, denies any headaches.  Referred by urgent care secondary to concerns for possible head injury with the patient's advanced age.    Hand Injury      Prior to Admission medications   Medication Sig Start Date End Date Taking? Authorizing Provider  Alcohol Swabs (ALCOHOL WIPES) 70 % PADS Use as directed for insulin  injections and to test blood sugar 4 times a day (Dx:  E11.65) 10/08/19   [provider]  Ascorbic Acid (VITAMIN C) 100 MG tablet Take 100 mg by mouth daily.    [provider]  aspirin 81 MG tablet Take 81 mg by mouth daily.    [provider]  B-D UF III MINI PEN NEEDLES 31G X 5 MM MISC  06/16/20   [provider]  Blood Glucose Calibration (ACCU-CHEK AVIVA) SOLN Use as directed 10/11/19   [provider]  Blood Glucose Monitoring Suppl (GLUCOCOM BLOOD GLUCOSE MONITOR) DEVI Use to test blood sugar as directed (Dx:  E11.65) 10/08/19   [provider]  Calcium Carb-Cholecalciferol (CALCIUM-VITAMIN D) 500-200 MG-UNIT tablet Take 2 tablets once a day    [provider]  Cholecalciferol 25 MCG (1000 UT) tablet Take 1 tablet by mouth daily.    [provider]  glucose blood (ACCU-CHEK AVIVA PLUS) test strip Use to test blood sugar 4 times a day (Dx:  E11.65) 10/08/19   [provider]  HUMALOG MIX 75/25 KWIKPEN (75-25) 100 UNIT/ML Kary  10/01/18   [provider]  insulin  aspart protamine - aspart (NOVOLOG  MIX 70/30  FLEXPEN) (70-30) 100 UNIT/ML FlexPen Inject 9 units before breakfast - 6 units before supper for diabetes 10/04/19   [provider]  lisinopril  (ZESTRIL ) 20 MG tablet Take 20 mg by mouth daily. 02/15/22   [provider]  Multiple Vitamins-Minerals (MULTI COMPLETE PO) Take 1 tablet by mouth daily.     [provider]  quinapril  (ACCUPRIL ) 40 MG tablet Take 1/2 tablet once a day for blood pressure 11/29/19   [provider]  rosuvastatin (CRESTOR) 5 MG tablet Take 5 mg by mouth daily. 04/12/19   [provider]    Allergies: Etodolac, Hydrochlorothiazide, Naproxen, Other, and Codeine    Review of Systems  Musculoskeletal:  Positive for arthralgias and joint swelling.  All other systems reviewed and are negative.   Updated Vital Signs BP 135/64 (BP Location: Left Arm)   Pulse 74   Temp 97.7 F (36.5 C) (Axillary)   Resp 17   Ht 5' (1.524 m)   Wt 63.5 kg   SpO2 100%   BMI 27.34 kg/m   Physical Exam Vitals and nursing note reviewed.  Constitutional:      General: She is awake. She is not in acute distress.    Appearance: Normal appearance. She is well-developed and normal weight.  HENT:     Head: Normocephalic. Contusion present.      Comments: Small contusion appreciated to the right forehead, no  surrounding depression, deformity, mildly tender to palpation.    Mouth/Throat:     Mouth: Mucous membranes are moist.     Pharynx: Oropharynx is clear.  Eyes:     Extraocular Movements: Extraocular movements intact.     Conjunctiva/sclera: Conjunctivae normal.     Pupils: Pupils are equal, round, and reactive to light.  Neck:     Trachea: Trachea and phonation normal.  Cardiovascular:     Rate and Rhythm: Normal rate and regular rhythm.     Pulses: Normal pulses.          Radial pulses are 2+ on the right side and 2+ on the left side.     Heart sounds: Normal heart sounds, S1 normal and S2 normal. No murmur heard.    No friction rub. No  gallop.  Pulmonary:     Effort: Pulmonary effort is normal.     Breath sounds: Normal breath sounds.  Abdominal:     General: Abdomen is flat. Bowel sounds are normal.     Palpations: Abdomen is soft.  Musculoskeletal:        General: Normal range of motion.     Right wrist: Normal.     Left wrist: Normal.     Right hand: Swelling and tenderness present.     Left hand: Normal.     Cervical back: Full passive range of motion without pain, normal range of motion and neck supple. No spinous process tenderness or muscular tenderness.     Right lower leg: No edema.     Left lower leg: No edema.     Comments: Swelling and tenderness is appreciated across the dorsum of the right hand, specifically across the 1st, 2nd and 3rd MCP.  No scaphoid tenderness appreciated.  Normal flexion and extension of the fingers, limited pronation supination of the wrist secondary to pain.  Decreased flexion and extension of the wrist secondary to pain.  Lymphadenopathy:     Cervical: No cervical adenopathy.  Skin:    General: Skin is warm and dry.     Capillary Refill: Capillary refill takes less than 2 seconds.  Neurological:     General: No focal deficit present.     Mental Status: She is alert. Mental status is at baseline.  Psychiatric:        Mood and Affect: Mood normal.        Behavior: Behavior is cooperative.     (all labs ordered are listed, but only abnormal results are displayed) Labs Reviewed - No data to display  EKG: None  Radiology: DG Hand Complete Right Result Date: 08/21/2024 CLINICAL DATA:  fall yesterday with pain EXAM: RIGHT HAND - COMPLETE 3+ VIEW COMPARISON:  None Available. FINDINGS: Osteopenia. No acute fracture or dislocation. Severe joint space narrowing throughout the PIP and DIPs with osseous remodeling. Subcortical cyst formation along the scaphoid. No definitive erosion is identified. Asymmetric joint space narrowing and osteophyte formation of the second MCP. No  unexpected radiopaque foreign body. Scattered chondrocalcinosis. IMPRESSION: 1. No acute fracture or dislocation. 2. Severe degenerative changes of the hand most pronounced in the phalanges. 3. If there is a persistent clinical concern for scaphoid fracture, recommend immobilization and follow-up radiographs in 2 weeks versus MRI. Electronically Signed   By: Corean Salter M.D.   On: 08/21/2024 14:02     Procedures   Medications Ordered in the ED - No data to display  Medical Decision Making Amount and/or Complexity of Data Reviewed Radiology: ordered.  Risk Prescription drug management.   X-ray imaging does not show any acute fracture in the right hand or wrist, and physical exam also does not suggest acute fracture, there is no scaphoid tenderness, and thus we will manage this as a right wrist sprain, contusion, and apply Ace wrap and cold compress to same.  Manage symptomatically for pain with ibuprofen  and/or Tylenol .  Based on age and fall with obvious minor injury to the head, CT scan was ordered of the head and the C-spine to evaluate for possible spinal fracture as well as possible cranial fracture and intracranial bleed.  This is awaiting results at time of care transfer.  Care signed out to S. Upstill, PA-C, disposition pending imaging, would anticipate discharge with normal CT findings.     Final diagnoses:  None    ED Discharge Orders     None          Myriam Dorn BROCKS, [place] 08/21/24 1513    Garrick Charleston, MD 08/22/24 1544

## 2024-08-21 NOTE — Discharge Instructions (Signed)
 As we discussed, your CT scans of the head and neck are negative for any serious injury. Take Tylenol  for pain and soreness. Follow up with your doctor for recheck if wrist pain persists, or for new symptoms of concerns. Return to the ED with any new or worsening symptoms at any time.

## 2024-08-21 NOTE — ED Provider Notes (Signed)
  Physical Exam  BP 135/64 (BP Location: Left Arm)   Pulse 74   Temp 97.7 F (36.5 C) (Axillary)   Resp 17   Ht 5' (1.524 m)   Wt 63.5 kg   SpO2 100%   BMI 27.34 kg/m   Physical Exam  Procedures  Procedures  ED Course / MDM    Medical Decision Making Amount and/or Complexity of Data Reviewed Radiology: ordered.  Risk Prescription drug management.   Fall yesterday at home onto right hand Urgent care today 89 yo found to have scalp hematoma - getting CT Hand xray negative.   Plan: when CT negative can discharge home.   CT scan results per radiology:  IMPRESSION: 1. No acute abnormality of the cervical spine related to the reported neck trauma. 2. Multilevel degenerative changes in the cervical spine with moderate bilateral foraminal narrowing at C3-4, C4-5, and C5-6.  SOFT TISSUES AND SKULL: Right supraorbital scalp soft tissue hematoma is present without underlying fracture. No skull fracture.  ACE wrap placed on wrist for comfort. Patient has a velcro brace for use as well. Discussed Tylenol  for soreness, PCP follow up prn.       Odell Balls, PA-C 08/21/24 1646    Garrick Charleston, MD 08/22/24 [phone]

## 2024-08-21 NOTE — ED Triage Notes (Signed)
 Patient states she was coming down the hall last night in her home and fell. Patient hurt the right hand and has a knot on the right frontal side of her head. No known LOC. States she had to crawl to her bed room, she could not get up.

## 2024-08-21 NOTE — ED Provider Notes (Addendum)
 Patient presents to clinic over concern of right hand pain and hematoma to the right forehead after an unwitnessed fall last night.  Patient a mopped up her hallway, thought it was dry.  Was wearing bedroom slippers when she fell.  Reports she does not remember how she fell or if she hit her head during the fall, denies syncope or loss of consciousness.  Had to crawl around until she was able to find something to pull herself up with.  Does not remember what she used to pull herself back up, may have been her living room furniture.  Has right hand pain and swelling around the 1st, 2nd and 3rd metatarsals.  Does have hematoma to the right forehead area.  Is not on blood thinners.  Denies headache, vision changes or emesis.  Discussed according to [place] CT head injury rule that patient is candidate for head CT d/t her age.  Family at bedside, agreeable to transport via POV to Karen [name], ED for further evaluation.   [name], Yasmina [place]  N, FNP 08/21/24 1253    [name], Ayat Drenning  N, FNP 08/21/24 1254

## 2024-09-14 ENCOUNTER — Ambulatory Visit
Admission: RE | Admit: 2024-09-14 | Discharge: 2024-09-14 | Disposition: A | Source: Ambulatory Visit | Attending: Internal Medicine | Admitting: Internal Medicine

## 2024-09-14 DIAGNOSIS — Z1231 Encounter for screening mammogram for malignant neoplasm of breast: Secondary | ICD-10-CM

## 2024-09-20 ENCOUNTER — Ambulatory Visit: Admitting: Podiatry
